# Patient Record
Sex: Female | Born: 1945 | Race: Black or African American | Hispanic: No | Marital: Married | Smoking: Never smoker
Health system: Southern US, Community
[De-identification: ages and names within clinical notes are randomized; demographics above are authoritative.]

## PROBLEM LIST (undated history)

## (undated) DIAGNOSIS — H409 Unspecified glaucoma: Secondary | ICD-10-CM

## (undated) DIAGNOSIS — K219 Gastro-esophageal reflux disease without esophagitis: Secondary | ICD-10-CM

## (undated) DIAGNOSIS — I10 Essential (primary) hypertension: Secondary | ICD-10-CM

## (undated) HISTORY — PX: ABDOMINAL HYSTERECTOMY: SHX81

## (undated) HISTORY — PX: NEPHRECTOMY: SHX65

## (undated) HISTORY — PX: APPENDECTOMY: SHX54

---

## 2015-11-11 ENCOUNTER — Encounter (HOSPITAL_BASED_OUTPATIENT_CLINIC_OR_DEPARTMENT_OTHER): Payer: Self-pay

## 2015-11-11 ENCOUNTER — Emergency Department (HOSPITAL_BASED_OUTPATIENT_CLINIC_OR_DEPARTMENT_OTHER)
Admission: EM | Admit: 2015-11-11 | Discharge: 2015-11-11 | Disposition: A | Discharge status: Home / Self Care | Payer: Medicare Other | Attending: Emergency Medicine | Admitting: Emergency Medicine

## 2015-11-11 ENCOUNTER — Emergency Department (HOSPITAL_BASED_OUTPATIENT_CLINIC_OR_DEPARTMENT_OTHER): Payer: Medicare Other

## 2015-11-11 DIAGNOSIS — Y999 Unspecified external cause status: Secondary | ICD-10-CM | POA: Insufficient documentation

## 2015-11-11 DIAGNOSIS — Y92811 Bus as the place of occurrence of the external cause: Secondary | ICD-10-CM | POA: Insufficient documentation

## 2015-11-11 DIAGNOSIS — Y939 Activity, unspecified: Secondary | ICD-10-CM | POA: Diagnosis not present

## 2015-11-11 DIAGNOSIS — I10 Essential (primary) hypertension: Secondary | ICD-10-CM | POA: Insufficient documentation

## 2015-11-11 DIAGNOSIS — S93402A Sprain of unspecified ligament of left ankle, initial encounter: Secondary | ICD-10-CM | POA: Diagnosis not present

## 2015-11-11 DIAGNOSIS — M25572 Pain in left ankle and joints of left foot: Secondary | ICD-10-CM | POA: Diagnosis present

## 2015-11-11 DIAGNOSIS — X501XXA Overexertion from prolonged static or awkward postures, initial encounter: Secondary | ICD-10-CM | POA: Insufficient documentation

## 2015-11-11 HISTORY — DX: Gastro-esophageal reflux disease without esophagitis: K21.9

## 2015-11-11 HISTORY — DX: Essential (primary) hypertension: I10

## 2015-11-11 MED ORDER — NAPROXEN 500 MG PO TABS: 500.0000 mg | ORAL_TABLET | Freq: Two times a day (BID) | ORAL | Status: DC

## 2015-11-11 NOTE — ED Notes (Signed)
Left ankle pain and swelling since this morning when she stepped off the bus and rolled her ankle.  Pt was able to ambulate with moderate difficulty throughout the day.

## 2015-11-11 NOTE — ED Notes (Signed)
Pt verbalizes understanding of d/c instructions and denies any further need at this time. 

## 2015-11-11 NOTE — ED Notes (Signed)
Twisted left ankle stepping off bus step this am-presents to triage in w/c

## 2015-11-11 NOTE — ED Provider Notes (Signed)
CSN: [id number]     Arrival date & time 11/11/15  1906 History   First MD Initiated Contact with Patient 11/11/15 1932     Chief Complaint  Patient presents with  . Ankle Pain     (Consider location/radiation/quality/duration/timing/severity/associated sxs/prior Treatment) HPI Comments: Patient presents today with left ankle pain.  She states that the pain has been present since rolling her ankle while stepping of the bus this morning.  This caused her to fall to the ground.  She states that she did fall back unto her buttocks when she fell.  She denies hitting her head or LOC.  She states that she has been limping on this leg since that time, but is able to ambulate.  Swelling of the ankle has worsened as the day has progressed.  She did not take anything for pain prior to arrival.  She denies any pain of the neck, back, knees, or hips.  No numbness or tingling.    The history is provided by the patient.    Past Medical History  Diagnosis Date  . Hypertension   . GERD (gastroesophageal reflux disease)    Past Surgical History  Procedure Laterality Date  . Abdominal hysterectomy    . Appendectomy     No family history on file. Social History  Substance Use Topics  . Smoking status: Never Smoker   . Smokeless tobacco: None  . Alcohol Use: No   OB History    No data available     Review of Systems  All other systems reviewed and are negative.     Allergies  Penicillins  Home Medications   Prior to Admission medications   Medication Sig Start Date End Date Taking? Authorizing Provider  UNKNOWN TO PATIENT BP med, GERD med, vitamins   Yes Historical Provider, MD   BP 151/93 mmHg  Pulse 84  Temp(Src) 98.9 F (37.2 C)  Resp 18  Ht  (1.549 m)  Wt 63.504 kg  BMI 26.47 kg/m2  SpO2 98% Physical Exam  Constitutional: She appears well-developed and well-nourished.  HENT:  Head: Normocephalic and atraumatic.  Neck: Normal range of motion. Neck supple.   Cardiovascular: Normal rate, regular rhythm and normal heart sounds.   Pulses:      Dorsalis pedis pulses are 2+ on the right side, and 2+ on the left side.  Pulmonary/Chest: Effort normal and breath sounds normal.  Musculoskeletal: Normal range of motion.       Left hip: She exhibits normal range of motion and no tenderness.       Left knee: She exhibits normal range of motion. No tenderness found.       Left ankle: She exhibits swelling. She exhibits normal range of motion, no ecchymosis and no deformity. Tenderness. Lateral malleolus and medial malleolus tenderness found. Achilles tendon normal. Achilles tendon exhibits no defect and normal Thompson's test results.       Cervical back: She exhibits normal range of motion, no tenderness, no bony tenderness and no swelling.       Thoracic back: She exhibits normal range of motion, no tenderness, no bony tenderness and no swelling.       Lumbar back: She exhibits normal range of motion, no tenderness, no bony tenderness and no swelling.  Diffuse swelling of the left ankle Left Achilles tendon is intact   Neurological: She is alert.  Distal sensation of left foot intact  Skin: Skin is warm and dry.  Psychiatric: She has a normal mood  and affect.  Nursing note and vitals reviewed.   ED Course  Procedures (including critical care time) Labs Review Labs Reviewed - No data to display  Imaging Review Dg Ankle Complete Left  11/11/2015  CLINICAL DATA:  Acute onset of left ankle pain and swelling, status post fall. Initial encounter. EXAM: LEFT ANKLE COMPLETE - 3+ VIEW COMPARISON:  None. FINDINGS: There is no evidence of fracture or dislocation. The ankle mortise is intact; the interosseous space is within normal limits. No talar tilt or subluxation is seen. Plantar and posterior calcaneal spurs are seen. An os peroneum is noted. The joint spaces are preserved. Diffuse soft tissue swelling is noted about the ankle. Edema is seen at Kager's fat  pad. IMPRESSION: 1. No evidence of fracture or dislocation. 2. Os peroneum noted. 3. Edema noted at Kager's fat pad. Electronically Signed   By: Roanna RaiderJeffery  Chang M.D.   On: 11/11/2015 19:52   I have personally reviewed and evaluated these images and lab results as part of my medical decision-making.   EKG Interpretation None      MDM   Final diagnoses:  None   Patient presents today with left ankle pain after rolling the ankle this morning while stepping off a bus.  Xray is negative for fracture or dislocation.  Xray does show edema at Kager's fat pad.  On exam, Achilles tendon is intact.  Negative Thompson's sign.  Neurovascularly intact.  Patient given ankle ASO and crutches.  Stable for discharge.  Return precautions given.      Santiago GladHeather Alenah Sarria, PA-C 11/12/15 2238  Cathren LaineKevin Steinl, MD 11/13/15 [phone]

## 2015-11-16 ENCOUNTER — Encounter: Payer: Self-pay | Admitting: Family Medicine

## 2015-11-16 ENCOUNTER — Ambulatory Visit (INDEPENDENT_AMBULATORY_CARE_PROVIDER_SITE_OTHER): Payer: Medicare Other | Admitting: Family Medicine

## 2015-11-16 VITALS — BP 153/96 | HR 69 | Ht 61.0 in | Wt 140.0 lb

## 2015-11-16 DIAGNOSIS — S99912A Unspecified injury of left ankle, initial encounter: Secondary | ICD-10-CM | POA: Diagnosis present

## 2015-11-16 NOTE — Patient Instructions (Signed)
You have a Grade 2 ankle sprain. Ice the area for 15 minutes at a time, 3-4 times a day Finish the naproxen from the ER then switch to taking tylenol 1-2 tabs three times a day as needed for pain Elevate above the level of your heart when possible if swollen. Use laceup ankle brace to help with stability while you recover from this injury. Come out of the boot/brace twice a day to do Up/down and alphabet exercises 2-3 sets of each. Start theraband strengthening exercises when directed - once a day 3 sets of 10 - I'd wait about a week to start these. Consider physical therapy for strengthening and balance exercises. If not improving as expected, we may repeat x-rays or consider further testing like an MRI. Follow up with me in 2 weeks.

## 2015-11-17 DIAGNOSIS — S99912A Unspecified injury of left ankle, initial encounter: Secondary | ICD-10-CM | POA: Insufficient documentation

## 2015-11-17 NOTE — Progress Notes (Signed)
PCP: No primary care provider on file.  Subjective:   HPI: Patient is a 70 y.o. female here for left ankle injury.  Patient reports on 5/31 [place] and accidentally inverted her left ankle. Could bear weight initially but was hobbling. By end of work day after sitting for a while couldn't get up due to pain and swelling lateral left ankle. No prior injuries. Has been taking naprosyn, icing, and using ASO. Pain is 5/10 level now, sharp and lateral. No skin changes, numbness.  Past Medical History  Diagnosis Date  . Hypertension   . GERD (gastroesophageal reflux disease)     Current Outpatient Prescriptions on File Prior to Visit  Medication Sig Dispense Refill  . naproxen (NAPROSYN) 500 MG tablet Take 1 tablet (500 mg total) by mouth 2 (two) times daily. 15 tablet 0  . UNKNOWN TO PATIENT BP med, GERD med, vitamins     No current facility-administered medications on file prior to visit.    Past Surgical History  Procedure Laterality Date  . Abdominal hysterectomy    . Appendectomy      Allergies  Allergen Reactions  . Penicillins Swelling    Social History   Social History  . Marital Status: Married    Spouse Name: N/A  . Number of Children: N/A  . Years of Education: N/A   Occupational History  . Not on file.   Social History Main Topics  . Smoking status: Never Smoker   . Smokeless tobacco: Not on file  . Alcohol Use: No  . Drug Use: No  . Sexual Activity: Not on file   Other Topics Concern  . Not on file   Social History Narrative    No family history on file.  BP 153/96 mmHg  Pulse 69  Ht 5\' 1"  (1.549 m)  Wt 140 lb (63.504 kg)  BMI 26.47 kg/m2  Review of Systems: See HPI above.    Objective:  Physical Exam:  Gen: NAD, comfortable in exam room  Left ankle: Mild anterolateral swelling.  No bruising, other deformity. Mild limitation ROM all directions. TTP over ATFL.  No malleolar, navicular, base 5th, other  tenderness.  Minimal tenderness lateral gastroc. 1+ ant drawer and talar tilt - talar tilt painful. Negative syndesmotic compression. Thompsons test negative. NV intact distally.  Right ankle: FROM without pain.    Assessment & Plan:  1. Left ankle injury - Independently reviewed radiographs and no evidence fracture.  Brief MSK u/s shows no cortical irregularities of fibular head, malleoli.  2/2 ankle sprain.  Continue naproxen, transition to tylenol.  Icing, ASO for stability.  Shown home exercises to do daily.  F/u in 2 weeks for reevaluation.

## 2015-11-17 NOTE — Assessment & Plan Note (Signed)
Independently reviewed radiographs and no evidence fracture.  Brief MSK u/s shows no cortical irregularities of fibular head, malleoli.  2/2 ankle sprain.  Continue naproxen, transition to tylenol.  Icing, ASO for stability.  Shown home exercises to do daily.  F/u in 2 weeks for reevaluation.

## 2015-12-04 ENCOUNTER — Ambulatory Visit: Payer: Medicare Other | Admitting: Family Medicine

## 2015-12-10 ENCOUNTER — Ambulatory Visit: Payer: Medicare Other | Admitting: Family Medicine

## 2021-05-03 ENCOUNTER — Other Ambulatory Visit: Payer: Self-pay

## 2021-05-03 ENCOUNTER — Emergency Department (HOSPITAL_BASED_OUTPATIENT_CLINIC_OR_DEPARTMENT_OTHER): Payer: Medicare Other

## 2021-05-03 ENCOUNTER — Emergency Department (HOSPITAL_BASED_OUTPATIENT_CLINIC_OR_DEPARTMENT_OTHER)
Admission: EM | Admit: 2021-05-03 | Discharge: 2021-05-04 | Disposition: A | Discharge status: Home / Self Care | Payer: Medicare Other | Source: Home / Self Care | Attending: Student | Admitting: Student

## 2021-05-03 ENCOUNTER — Encounter (HOSPITAL_BASED_OUTPATIENT_CLINIC_OR_DEPARTMENT_OTHER): Payer: Self-pay | Admitting: *Deleted

## 2021-05-03 DIAGNOSIS — Z20822 Contact with and (suspected) exposure to covid-19: Secondary | ICD-10-CM | POA: Insufficient documentation

## 2021-05-03 DIAGNOSIS — N3090 Cystitis, unspecified without hematuria: Secondary | ICD-10-CM

## 2021-05-03 DIAGNOSIS — Z79899 Other long term (current) drug therapy: Secondary | ICD-10-CM | POA: Insufficient documentation

## 2021-05-03 DIAGNOSIS — N3000 Acute cystitis without hematuria: Secondary | ICD-10-CM | POA: Diagnosis not present

## 2021-05-03 DIAGNOSIS — R300 Dysuria: Secondary | ICD-10-CM | POA: Insufficient documentation

## 2021-05-03 DIAGNOSIS — I10 Essential (primary) hypertension: Secondary | ICD-10-CM | POA: Insufficient documentation

## 2021-05-03 DIAGNOSIS — R7881 Bacteremia: Secondary | ICD-10-CM | POA: Diagnosis not present

## 2021-05-03 DIAGNOSIS — R509 Fever, unspecified: Secondary | ICD-10-CM | POA: Insufficient documentation

## 2021-05-03 DIAGNOSIS — R1030 Lower abdominal pain, unspecified: Secondary | ICD-10-CM | POA: Insufficient documentation

## 2021-05-03 LAB — CBC
HCT: 41.4 % (ref 36.0–46.0)
Hemoglobin: 13.8 g/dL (ref 12.0–15.0)
MCH: 30.5 pg (ref 26.0–34.0)
MCHC: 33.3 g/dL (ref 30.0–36.0)
MCV: 91.4 fL (ref 80.0–100.0)
Platelets: 302 10*3/uL (ref 150–400)
RBC: 4.53 MIL/uL (ref 3.87–5.11)
RDW: 11.5 % (ref 11.5–15.5)
WBC: 18.6 10*3/uL — ABNORMAL HIGH (ref 4.0–10.5)
nRBC: 0 % (ref 0.0–0.2)

## 2021-05-03 LAB — COMPREHENSIVE METABOLIC PANEL
ALT: 15 U/L (ref 0–44)
AST: 15 U/L (ref 15–41)
Albumin: 4.5 g/dL (ref 3.5–5.0)
Alkaline Phosphatase: 116 U/L (ref 38–126)
Anion gap: 11 (ref 5–15)
BUN: 17 mg/dL (ref 8–23)
CO2: 26 mmol/L (ref 22–32)
Calcium: 9.3 mg/dL (ref 8.9–10.3)
Chloride: 99 mmol/L (ref 98–111)
Creatinine, Ser: 1 mg/dL (ref 0.44–1.00)
GFR, Estimated: 59 mL/min — ABNORMAL LOW (ref >60)
Glucose, Bld: 100 mg/dL — ABNORMAL HIGH (ref 70–99)
Potassium: 3.1 mmol/L — ABNORMAL LOW (ref 3.5–5.1)
Sodium: 136 mmol/L (ref 135–145)
Total Bilirubin: 0.6 mg/dL (ref 0.3–1.2)
Total Protein: 8.3 g/dL — ABNORMAL HIGH (ref 6.5–8.1)

## 2021-05-03 LAB — URINALYSIS, ROUTINE W REFLEX MICROSCOPIC
Bilirubin Urine: NEGATIVE
Glucose, UA: NEGATIVE mg/dL
Ketones, ur: NEGATIVE mg/dL
Nitrite: NEGATIVE
Protein, ur: 30 mg/dL — AB
Specific Gravity, Urine: 1.02 (ref 1.005–1.030)
pH: 7 (ref 5.0–8.0)

## 2021-05-03 LAB — LACTIC ACID, PLASMA: Lactic Acid, Venous: 1.1 mmol/L (ref 0.5–1.9)

## 2021-05-03 LAB — RESP PANEL BY RT-PCR (FLU A&B, COVID) ARPGX2
Influenza A by PCR: NEGATIVE
Influenza B by PCR: NEGATIVE
SARS Coronavirus 2 by RT PCR: NEGATIVE

## 2021-05-03 LAB — URINALYSIS, MICROSCOPIC (REFLEX): WBC, UA: >50 WBC/hpf (ref 0–5)

## 2021-05-03 LAB — APTT: aPTT: 26 seconds (ref 24–36)

## 2021-05-03 LAB — PROTIME-INR
INR: 1 (ref 0.8–1.2)
Prothrombin Time: 12.8 seconds (ref 11.4–15.2)

## 2021-05-03 LAB — LIPASE, BLOOD: Lipase: 33 U/L (ref 11–51)

## 2021-05-03 MED ORDER — LACTATED RINGERS IV SOLN: INTRAVENOUS | Status: DC

## 2021-05-03 MED ORDER — LACTATED RINGERS IV BOLUS (SEPSIS): 250.0000 mL | Freq: Once | INTRAVENOUS | Status: DC

## 2021-05-03 MED ORDER — ONDANSETRON HCL 4 MG/2ML IJ SOLN: 4.0000 mg | Freq: Once | INTRAMUSCULAR | Status: DC

## 2021-05-03 MED ORDER — SODIUM CHLORIDE 0.9 % IV SOLN: 2.0000 g | Freq: Two times a day (BID) | INTRAVENOUS | Status: DC

## 2021-05-03 MED ORDER — SODIUM CHLORIDE 0.9 % IV SOLN
2.0000 g | Freq: Once | INTRAVENOUS | Status: AC
  Administered 2021-05-03: 2 g via INTRAVENOUS
  Filled 2021-05-03: qty 20

## 2021-05-03 MED ORDER — LACTATED RINGERS IV BOLUS (SEPSIS): 1000.0000 mL | Freq: Once | INTRAVENOUS | Status: DC

## 2021-05-03 MED ORDER — VANCOMYCIN HCL IN DEXTROSE 1-5 GM/200ML-% IV SOLN
1000.0000 mg | Freq: Once | INTRAVENOUS | Status: DC
  Filled 2021-05-03: qty 200

## 2021-05-03 MED ORDER — VANCOMYCIN HCL 750 MG/150ML IV SOLN: 750.0000 mg | INTRAVENOUS | Status: DC

## 2021-05-03 MED ORDER — SODIUM CHLORIDE 0.9 % IV SOLN
2.0000 g | Freq: Once | INTRAVENOUS | Status: DC
  Filled 2021-05-03: qty 2

## 2021-05-03 MED ORDER — METRONIDAZOLE 500 MG/100ML IV SOLN
500.0000 mg | Freq: Once | INTRAVENOUS | Status: AC
  Administered 2021-05-03: 500 mg via INTRAVENOUS
  Filled 2021-05-03: qty 100

## 2021-05-03 MED ORDER — ONDANSETRON 4 MG PO TBDP: 4.0000 mg | ORAL_TABLET | Freq: Three times a day (TID) | ORAL | 0 refills | Status: AC | PRN

## 2021-05-03 MED ORDER — LACTATED RINGERS IV BOLUS (SEPSIS)
1000.0000 mL | Freq: Once | INTRAVENOUS | Status: AC
  Administered 2021-05-03: 1000 mL via INTRAVENOUS

## 2021-05-03 MED ORDER — CEFADROXIL 500 MG PO CAPS: 500.0000 mg | ORAL_CAPSULE | Freq: Two times a day (BID) | ORAL | 0 refills | Status: DC

## 2021-05-03 MED ORDER — IOHEXOL 300 MG/ML  SOLN
100.0000 mL | Freq: Once | INTRAMUSCULAR | Status: AC | PRN
  Administered 2021-05-03: 100 mL via INTRAVENOUS

## 2021-05-03 NOTE — ED Triage Notes (Signed)
C/o right lower abd pain , chills , fever x  3 hrs ago

## 2021-05-03 NOTE — Sepsis Progress Note (Signed)
Elink following for Sepsis Protocol 

## 2021-05-03 NOTE — Progress Notes (Addendum)
Pharmacy Antibiotic Note  Tiffany Schultz is a 75 y.o. female admitted on 05/03/2021 with sepsis of unknown source.  WBC 18.6 and tmax 100 F. Pharmacy has been consulted for vancomycin and  cefepime dosing.  AUC goal: 400-550  Plan: Cefepime 2g q12h  Vancomycin 1000mg  x 1 load  Vancomycin 750mg  q24h (eAUC 413, Scr 1.0) F/u renal function and adjust regimen as needed Vancomycin as needed for monitoring   Height: 5\' 2"  (157.5 cm) Weight: 67.6 kg (149 lb) IBW/kg (Calculated) : 50.1  Temp (24hrs), Avg:100 F (37.8 C), Min:100 F (37.8 C), Max:100 F (37.8 C)  Recent Labs  Lab 05/03/21 1946  WBC 18.6*  CREATININE 1.00    Estimated Creatinine Clearance: 45.2 mL/min (by C-G formula based on SCr of 1 mg/dL).    Allergies  Allergen Reactions   Penicillins Swelling    Antimicrobials this admission: Flagyl 11/21 > Vancomycin 11/21> Cefepime 11/21 >  Dose adjustments this admission:  Microbiology results: 11/21 BCx: sent 11/21 UCx: sent   Thank you for allowing pharmacy to participate in this patient's care.  12/21, PharmD PGY1 Acute Care Resident  05/03/2021,  ___________  Addendum:  Discussed with Dr. 12/21 and likely urinary source. Cefepime and Vancomycin discontinued. Will continue flagyl and ceftriaxone for antimicrobial coverage.   Thank you for allowing pharmacy to participate in this patient's care.  Marja Kays, PharmD PGY1 Acute Care Resident  05/03/2021,9:28 PM

## 2021-05-03 NOTE — ED Provider Notes (Signed)
[facility] EMERGENCY DEPARTMENT Provider Note   CSN: [id number] Arrival date & time: 05/03/21  5027     History Chief Complaint  Patient presents with   Abdominal Pain    Tiffany Schultz is a 75 y.o. female with PMH GERD, hypertension who presents the emergency department for evaluation of abdominal pain, dysuria and fever.  Patient states that she has had symptoms over the course of this week but it acutely worsened over the last 3 hours.  She denies chest pain, shortness of breath, headache, diarrhea or other systemic symptoms.  Patient had single episode of emesis while here in the emergency department.      Abdominal Pain Associated symptoms: dysuria, fever and vomiting   Associated symptoms: no chest pain, no chills, no cough, no hematuria, no shortness of breath and no sore throat       Past Medical History:  Diagnosis Date   GERD (gastroesophageal reflux disease)    Hypertension     Patient Active Problem List   Diagnosis Date Noted   Left ankle injury 11/17/2015    Past Surgical History:  Procedure Laterality Date   ABDOMINAL HYSTERECTOMY     APPENDECTOMY     NEPHRECTOMY       OB History   No obstetric history on file.     No family history on file.  Social History   Tobacco Use   Smoking status: Never  Substance Use Topics   Alcohol use: No    Alcohol/week: 0.0 standard drinks   Drug use: No    Home Medications Prior to Admission medications   Medication Sig Start Date End Date Taking? Authorizing Provider  naproxen (NAPROSYN) 500 MG tablet Take 1 tablet (500 mg total) by mouth 2 (two) times daily. 11/11/15   Santiago Glad, PA-C  ranitidine (ZANTAC) 150 MG tablet  09/04/15   [provider]  triamterene-hydrochlorothiazide (DYAZIDE) 37.5-25 MG capsule Take by mouth.    [provider]  UNKNOWN TO PATIENT BP med, GERD med, vitamins    [provider]    Allergies    Penicillins  Review of Systems    Review of Systems  Constitutional:  Positive for fever. Negative for chills.  HENT:  Negative for ear pain and sore throat.   Eyes:  Negative for pain and visual disturbance.  Respiratory:  Negative for cough and shortness of breath.   Cardiovascular:  Negative for chest pain and palpitations.  Gastrointestinal:  Positive for abdominal pain and vomiting.  Genitourinary:  Positive for dysuria. Negative for hematuria.  Musculoskeletal:  Negative for arthralgias and back pain.  Skin:  Negative for color change and rash.  Neurological:  Negative for seizures and syncope.  All other systems reviewed and are negative.  Physical Exam Updated Vital Signs BP 129/85   Pulse (!) 102   Temp 100 F (37.8 C) (Oral)   Resp (!) 26   Ht 5\' 2"  (1.575 m)   Wt 67.6 kg   SpO2 98%   BMI 27.25 kg/m   Physical Exam Vitals and nursing note reviewed.  Constitutional:      General: She is not in acute distress.    Appearance: She is well-developed.  HENT:     Head: Normocephalic and atraumatic.  Eyes:     Conjunctiva/sclera: Conjunctivae normal.  Cardiovascular:     Rate and Rhythm: Regular rhythm. Tachycardia present.     Heart sounds: No murmur heard. Pulmonary:     Effort: Pulmonary effort is normal.  No respiratory distress.     Breath sounds: Normal breath sounds.  Abdominal:     Palpations: Abdomen is soft.     Tenderness: There is abdominal tenderness in the suprapubic area.  Musculoskeletal:        General: No swelling.     Cervical back: Neck supple.  Skin:    General: Skin is warm and dry.     Capillary Refill: Capillary refill takes less than 2 seconds.  Neurological:     Mental Status: She is alert.  Psychiatric:        Mood and Affect: Mood normal.    ED Results / Procedures / Treatments   Labs (all labs ordered are listed, but only abnormal results are displayed) Labs Reviewed  COMPREHENSIVE METABOLIC PANEL - Abnormal; Notable for the following components:      Result  Value   Potassium 3.1 (*)    Glucose, Bld 100 (*)    Total Protein 8.3 (*)    GFR, Estimated 59 (*)    All other components within normal limits  CBC - Abnormal; Notable for the following components:   WBC 18.6 (*)    All other components within normal limits  URINALYSIS, ROUTINE W REFLEX MICROSCOPIC - Abnormal; Notable for the following components:   APPearance CLOUDY (*)    Hgb urine dipstick MODERATE (*)    Protein, ur 30 (*)    Leukocytes,Ua LARGE (*)    All other components within normal limits  URINALYSIS, MICROSCOPIC (REFLEX) - Abnormal; Notable for the following components:   Bacteria, UA MANY (*)    All other components within normal limits  RESP PANEL BY RT-PCR (FLU A&B, COVID) ARPGX2  CULTURE, BLOOD (ROUTINE X 2)  CULTURE, BLOOD (ROUTINE X 2)  URINE CULTURE  LIPASE, BLOOD  LACTIC ACID, PLASMA  PROTIME-INR  APTT  LACTIC ACID, PLASMA    EKG None  Radiology CT ABDOMEN PELVIS W CONTRAST  Result Date: 05/03/2021 CLINICAL DATA:  Abdominal pain and fever. EXAM: CT ABDOMEN AND PELVIS WITH CONTRAST TECHNIQUE: Multidetector CT imaging of the abdomen and pelvis was performed using the standard protocol following bolus administration of intravenous contrast. CONTRAST:  OMNIPAQUE IOHEXOL 300 MG/ML  SOLN COMPARISON:  None. FINDINGS: Lower chest: The visualized lung bases are clear. No intra-abdominal free air or free fluid. Hepatobiliary: No focal liver abnormality is seen. No gallstones, gallbladder wall thickening, or biliary dilatation. Pancreas: Unremarkable. No pancreatic ductal dilatation or surrounding inflammatory changes. Spleen: Normal in size without focal abnormality. Adrenals/Urinary Tract: The adrenal glands are unremarkable. Status post prior left nephrectomy. Subcentimeter right renal hypodense lesion is not characterized. There is no hydronephrosis. The visualized ureter is unremarkable. Mild diffuse thickened appearance of the bladder wall with mucosal  enhancement. Correlation with urinalysis recommended to exclude cystitis. Stomach/Bowel: There is diffuse colonic diverticulosis without active inflammatory changes. There is no bowel obstruction or active inflammation. Appendectomy. Vascular/Lymphatic: The abdominal aorta and IVC are unremarkable. No portal venous gas. There is no adenopathy. Reproductive: Hysterectomy. Other: None Musculoskeletal: Degenerative changes of the spine and hips. No acute osseous pathology. IMPRESSION: 1. Mild diffuse thickened appearance of the bladder wall with mucosal enhancement. Correlation with urinalysis recommended to exclude cystitis. 2. Colonic diverticulosis. No bowel obstruction. 3. Status post prior left nephrectomy. Electronically Signed   By: Elgie Collard M.D.   On: 05/03/2021 22:04   DG Chest Port 1 View  Result Date: 05/03/2021 CLINICAL DATA:  Questionable sepsis - evaluate for abnormality Fever and chills. EXAM: PORTABLE  CHEST 1 VIEW COMPARISON:  Radiograph 08/23/2012.  CT 07/29/2020 FINDINGS: The cardiomediastinal contours are normal. Aortic atherosclerosis. Pulmonary vasculature is normal. No consolidation, pleural effusion, or pneumothorax. No acute osseous abnormalities are seen. IMPRESSION: No acute chest findings. Electronically Signed   By: Keith Rake M.D.   On: 05/03/2021 20:30    Procedures Procedures   Medications Ordered in ED Medications  lactated ringers infusion (has no administration in time range)  metroNIDAZOLE (FLAGYL) IVPB 500 mg (500 mg Intravenous New Bag/Given 05/03/21 2237)  ondansetron (ZOFRAN) injection 4 mg (has no administration in time range)  lactated ringers bolus 1,000 mL (1,000 mLs Intravenous New Bag/Given 05/03/21 2202)  cefTRIAXone (ROCEPHIN) 2 g in sodium chloride 0.9 % 100 mL IVPB (2 g Intravenous New Bag/Given 05/03/21 2204)  iohexol (OMNIPAQUE) 300 MG/ML solution 100 mL (100 mLs Intravenous Contrast Given 05/03/21 2137)    ED Course  I have reviewed  the triage vital signs and the nursing notes.  Pertinent labs & imaging results that were available during my care of the patient were reviewed by me and considered in my medical decision making (see chart for details).    MDM Rules/Calculators/A&P                           Patient seen the emergency department for evaluation of dysuria, abdominal pain, nausea, vomiting and fever.  Physical exam reveals a tachycardic patient with suprapubic tenderness but is otherwise unremarkable.  Patient initially tachycardic in the 120s white count of 18.6.  Sepsis alert called as patient is SIRS positive with the source likely urinary.  Broad-spectrum antibiotics initiated with ceftriaxone and Flagyl.  Lactate is normal and thus patient received only 1 L lactated Ringer's and did not need 30 cc/kg.  As the patient is not in septic shock with normal blood pressure also negates the need for over fluid resuscitation.  Laboratory evaluation with a mild hypokalemia to 3.1, urinalysis with large leuk esterase and greater than 50 white blood cells concerning for cystitis.  CT abdomen pelvis unremarkable outside of evidence of cystitis.  She was given Zofran and on reevaluation, her heart rate had normalized and the patient was able to ambulate without difficulty while here in the emergency department.  She also use the bathroom here with no persistent dysuria.  Using shared decision-making, we decided that the patient would be safe for discharge at this time with oral cefadroxil and Zofran for home use.  She was given very strict return precautions which she voiced understanding.  She was instructed to call her primary care physician and inform them of the plan and was discharged on antibiotics.  Urine culture is pending and we will call the patient if susceptibilities are abnormal. Final Clinical Impression(s) / ED Diagnoses Final diagnoses:  None    Rx / DC Orders ED Discharge Orders     None        Kalieb Freeland,  Burech Mcfarland, MD 05/03/21 2340

## 2021-05-04 ENCOUNTER — Telehealth (HOSPITAL_BASED_OUTPATIENT_CLINIC_OR_DEPARTMENT_OTHER): Payer: Self-pay

## 2021-05-04 ENCOUNTER — Encounter (HOSPITAL_COMMUNITY): Payer: Self-pay

## 2021-05-04 ENCOUNTER — Inpatient Hospital Stay (HOSPITAL_COMMUNITY)
Admission: EM | Admit: 2021-05-04 | Discharge: 2021-05-06 | DRG: 690 | Disposition: A | Discharge status: Home / Self Care | Payer: Medicare Other | Attending: Family Medicine | Admitting: Family Medicine

## 2021-05-04 DIAGNOSIS — I10 Essential (primary) hypertension: Secondary | ICD-10-CM | POA: Diagnosis present

## 2021-05-04 DIAGNOSIS — E785 Hyperlipidemia, unspecified: Secondary | ICD-10-CM | POA: Diagnosis present

## 2021-05-04 DIAGNOSIS — Z880 Allergy status to penicillin: Secondary | ICD-10-CM

## 2021-05-04 DIAGNOSIS — R7881 Bacteremia: Secondary | ICD-10-CM | POA: Diagnosis present

## 2021-05-04 DIAGNOSIS — B9620 Unspecified Escherichia coli [E. coli] as the cause of diseases classified elsewhere: Secondary | ICD-10-CM | POA: Diagnosis present

## 2021-05-04 DIAGNOSIS — Z905 Acquired absence of kidney: Secondary | ICD-10-CM

## 2021-05-04 DIAGNOSIS — K219 Gastro-esophageal reflux disease without esophagitis: Secondary | ICD-10-CM | POA: Diagnosis present

## 2021-05-04 DIAGNOSIS — N3000 Acute cystitis without hematuria: Principal | ICD-10-CM | POA: Diagnosis present

## 2021-05-04 DIAGNOSIS — Z888 Allergy status to other drugs, medicaments and biological substances status: Secondary | ICD-10-CM

## 2021-05-04 DIAGNOSIS — Z20822 Contact with and (suspected) exposure to covid-19: Secondary | ICD-10-CM | POA: Diagnosis present

## 2021-05-04 DIAGNOSIS — D72829 Elevated white blood cell count, unspecified: Secondary | ICD-10-CM

## 2021-05-04 DIAGNOSIS — Z885 Allergy status to narcotic agent status: Secondary | ICD-10-CM

## 2021-05-04 DIAGNOSIS — Z791 Long term (current) use of non-steroidal anti-inflammatories (NSAID): Secondary | ICD-10-CM

## 2021-05-04 DIAGNOSIS — Z79899 Other long term (current) drug therapy: Secondary | ICD-10-CM

## 2021-05-04 LAB — CBC WITH DIFFERENTIAL/PLATELET
Abs Immature Granulocytes: 0.04 10*3/uL (ref 0.00–0.07)
Basophils Absolute: 0.1 10*3/uL (ref 0.0–0.1)
Basophils Relative: 1 %
Eosinophils Absolute: 0.1 10*3/uL (ref 0.0–0.5)
Eosinophils Relative: 0 %
HCT: 36.6 % (ref 36.0–46.0)
Hemoglobin: 12.1 g/dL (ref 12.0–15.0)
Immature Granulocytes: 0 %
Lymphocytes Relative: 14 %
Lymphs Abs: 2.2 10*3/uL (ref 0.7–4.0)
MCH: 30.3 pg (ref 26.0–34.0)
MCHC: 33.1 g/dL (ref 30.0–36.0)
MCV: 91.5 fL (ref 80.0–100.0)
Monocytes Absolute: 2 10*3/uL — ABNORMAL HIGH (ref 0.1–1.0)
Monocytes Relative: 13 %
Neutro Abs: 11.1 10*3/uL — ABNORMAL HIGH (ref 1.7–7.7)
Neutrophils Relative %: 72 %
Platelets: 272 10*3/uL (ref 150–400)
RBC: 4 MIL/uL (ref 3.87–5.11)
RDW: 11.6 % (ref 11.5–15.5)
WBC: 15.4 10*3/uL — ABNORMAL HIGH (ref 4.0–10.5)
nRBC: 0 % (ref 0.0–0.2)

## 2021-05-04 LAB — BLOOD CULTURE ID PANEL (REFLEXED) - BCID2

## 2021-05-04 LAB — COMPREHENSIVE METABOLIC PANEL
ALT: 15 U/L (ref 0–44)
AST: 22 U/L (ref 15–41)
Albumin: 3.8 g/dL (ref 3.5–5.0)
Alkaline Phosphatase: 98 U/L (ref 38–126)
Anion gap: 10 (ref 5–15)
BUN: 11 mg/dL (ref 8–23)
CO2: 25 mmol/L (ref 22–32)
Calcium: 8.9 mg/dL (ref 8.9–10.3)
Chloride: 102 mmol/L (ref 98–111)
Creatinine, Ser: 0.88 mg/dL (ref 0.44–1.00)
GFR, Estimated: >60 mL/min (ref >60)
Glucose, Bld: 96 mg/dL (ref 70–99)
Potassium: 3.7 mmol/L (ref 3.5–5.1)
Sodium: 137 mmol/L (ref 135–145)
Total Bilirubin: 1.2 mg/dL (ref 0.3–1.2)
Total Protein: 7.7 g/dL (ref 6.5–8.1)

## 2021-05-04 MED ORDER — AMLODIPINE BESYLATE 10 MG PO TABS
10.0000 mg | ORAL_TABLET | Freq: Every day | ORAL | Status: DC
  Administered 2021-05-05 – 2021-05-06 (×2): 10 mg via ORAL
  Filled 2021-05-04 (×2): qty 1

## 2021-05-04 MED ORDER — SODIUM CHLORIDE 0.9 % IV SOLN
2.0000 g | INTRAVENOUS | Status: DC
  Administered 2021-05-05: 2 g via INTRAVENOUS
  Filled 2021-05-04: qty 20

## 2021-05-04 MED ORDER — HYDRALAZINE HCL 20 MG/ML IJ SOLN: 10.0000 mg | INTRAMUSCULAR | Status: DC | PRN

## 2021-05-04 MED ORDER — ROSUVASTATIN CALCIUM 5 MG PO TABS
5.0000 mg | ORAL_TABLET | Freq: Every day | ORAL | Status: DC
  Administered 2021-05-05 – 2021-05-06 (×2): 5 mg via ORAL
  Filled 2021-05-04 (×2): qty 1

## 2021-05-04 MED ORDER — ENOXAPARIN SODIUM 40 MG/0.4ML IJ SOSY
40.0000 mg | PREFILLED_SYRINGE | INTRAMUSCULAR | Status: DC
  Filled 2021-05-04 (×2): qty 0.4

## 2021-05-04 MED ORDER — PANTOPRAZOLE SODIUM 40 MG PO TBEC
40.0000 mg | DELAYED_RELEASE_TABLET | Freq: Every day | ORAL | Status: DC
  Administered 2021-05-05 – 2021-05-06 (×2): 40 mg via ORAL
  Filled 2021-05-04 (×2): qty 1

## 2021-05-04 MED ORDER — SODIUM CHLORIDE 0.9 % IV SOLN
2.0000 g | Freq: Once | INTRAVENOUS | Status: AC
  Administered 2021-05-04: 2 g via INTRAVENOUS
  Filled 2021-05-04: qty 20

## 2021-05-04 MED ORDER — LACTATED RINGERS IV BOLUS
1000.0000 mL | Freq: Once | INTRAVENOUS | Status: AC
  Administered 2021-05-04: 1000 mL via INTRAVENOUS

## 2021-05-04 MED ORDER — ACETAMINOPHEN 650 MG RE SUPP: 650.0000 mg | Freq: Four times a day (QID) | RECTAL | Status: DC | PRN

## 2021-05-04 MED ORDER — ACETAMINOPHEN 325 MG PO TABS
650.0000 mg | ORAL_TABLET | Freq: Four times a day (QID) | ORAL | Status: DC | PRN
  Administered 2021-05-05: 650 mg via ORAL
  Filled 2021-05-04: qty 2

## 2021-05-04 MED ORDER — LATANOPROST 0.005 % OP SOLN
1.0000 [drp] | Freq: Every day | OPHTHALMIC | Status: DC
  Administered 2021-05-04 – 2021-05-05 (×2): 1 [drp] via OPHTHALMIC
  Filled 2021-05-04: qty 2.5

## 2021-05-04 NOTE — H&P (Signed)
History and Physical    Tiffany Schultz [id number] DOB: 10-21-1945 DOA: 05/04/2021  PCP: Francesca Oman, DO  Patient coming from: Home.  Chief Complaint: Burning urination and fever.  HPI: Tiffany Schultz is a 75 y.o. female with history of hypertension and hyperlipidemia had come to the ER yesterday at med center with complaints of having dysuria subjective feeling of fever chills suprapubic pain and also right flank pain ongoing for a week.  In the ER patient had a CT abdomen pelvis shows features concerning for cystitis UA is consistent with UTI.  Patient also febrile with labs showing leukocytosis.  Had blood cultures drawn patient was sent home after IV dose of antibiotics.  Blood cultures grew E. coli and patient was advised to come to the ER.  ED Course: In the ER patient is hemodynamically stable temperature is 100 F labs show BC count of [id number] metabolic panel unremarkable mild suprapubic tenderness.  COVID test pending.  Patient started on ceftriaxone.  Review of Systems: As per HPI, rest all negative.   Past Medical History:  Diagnosis Date   GERD (gastroesophageal reflux disease)    Hypertension     Past Surgical History:  Procedure Laterality Date   ABDOMINAL HYSTERECTOMY     APPENDECTOMY     NEPHRECTOMY       reports that she has never smoked. She does not have any smokeless tobacco history on file. She reports that she does not drink alcohol and does not use drugs.  Allergies  Allergen Reactions   Penicillins Swelling    Family History  Family history unknown: Yes    Prior to Admission medications   Medication Sig Start Date End Date Taking? Authorizing Provider  cefadroxil (DURICEF) 500 MG capsule Take 1 capsule (500 mg total) by mouth 2 (two) times daily for 7 days. 05/03/21 05/10/21  Kommor, Madison, MD  naproxen (NAPROSYN) 500 MG tablet Take 1 tablet (500 mg total) by mouth 2 (two) times daily. 11/11/15   Hyman Bible, PA-C  ondansetron (ZOFRAN ODT) 4  MG disintegrating tablet Take 1 tablet (4 mg total) by mouth every 8 (eight) hours as needed for nausea or vomiting. 05/03/21   Kommor, Debe Coder, MD  ranitidine (ZANTAC) 150 MG tablet  09/04/15   [provider]  triamterene-hydrochlorothiazide (DYAZIDE) 37.5-25 MG capsule Take by mouth.    [provider]  UNKNOWN TO PATIENT BP med, GERD med, vitamins    [provider]    Physical Exam: Constitutional: Moderately built and nourished. Vitals:   05/04/21 1845 05/04/21 1900 05/04/21 1915 05/04/21 2107  BP: 125/80 128/78 128/79 136/80  Pulse: 94 88 88 99  Resp: 18  18   Temp:    99.3 F (37.4 C)  TempSrc:    Oral  SpO2: 99% 98% 98% 97%   Eyes: Anicteric no pallor. ENMT: No discharge from the ears eyes nose and mouth. Neck: No mass felt.  No neck rigidity. Respiratory: No rhonchi or crepitations. Cardiovascular: S1-S2 heard. Abdomen: Mild suprapubic tenderness no guarding or rigidity. Musculoskeletal: No edema. Skin: No rash. Neurologic: Alert awake oriented to time place and person.  Moves all extremities. Psychiatric: Appears normal.  Normal affect.   Labs on Admission: I have personally reviewed following labs and imaging studies  CBC: Recent Labs  Lab 05/03/21 1946 05/04/21 1827  WBC 18.6* 15.4*  NEUTROABS  --  11.1*  HGB 13.8 12.1  HCT 41.4 36.6  MCV 91.4 91.5  PLT 302 [id number]   Basic Metabolic  Panel: Recent Labs  Lab 05/03/21 1946 05/04/21 1827  NA 136 137  K 3.1* 3.7  CL 99 102  CO2 26 25  GLUCOSE 100* 96  BUN 17 11  CREATININE 1.00 0.88  CALCIUM 9.3 8.9   GFR: Estimated Creatinine Clearance: 51.3 mL/min (by C-G formula based on SCr of 0.88 mg/dL). Liver Function Tests: Recent Labs  Lab 05/03/21 1946 05/04/21 1827  AST 15 22  ALT 15 15  ALKPHOS 116 98  BILITOT 0.6 1.2  PROT 8.3* 7.7  ALBUMIN 4.5 3.8   Recent Labs  Lab 05/03/21 1946  LIPASE 33   No results for input(s): AMMONIA in the last 168 hours. Coagulation  Profile: Recent Labs  Lab 05/03/21 2058  INR 1.0   Cardiac Enzymes: No results for input(s): CKTOTAL, CKMB, CKMBINDEX, TROPONINI in the last 168 hours. BNP (last 3 results) No results for input(s): PROBNP in the last 8760 hours. HbA1C: No results for input(s): HGBA1C in the last 72 hours. CBG: No results for input(s): GLUCAP in the last 168 hours. Lipid Profile: No results for input(s): CHOL, HDL, LDLCALC, TRIG, CHOLHDL, LDLDIRECT in the last 72 hours. Thyroid Function Tests: No results for input(s): TSH, T4TOTAL, FREET4, T3FREE, THYROIDAB in the last 72 hours. Anemia Panel: No results for input(s): VITAMINB12, FOLATE, FERRITIN, TIBC, IRON, RETICCTPCT in the last 72 hours. Urine analysis:    Component Value Date/Time   COLORURINE YELLOW 05/03/2021 2016   APPEARANCEUR CLOUDY (A) 05/03/2021 2016   LABSPEC 1.020 05/03/2021 2016   PHURINE 7.0 05/03/2021 2016   GLUCOSEU NEGATIVE 05/03/2021 2016   HGBUR MODERATE (A) 05/03/2021 2016   BILIRUBINUR NEGATIVE 05/03/2021 2016   KETONESUR NEGATIVE 05/03/2021 2016   PROTEINUR 30 (A) 05/03/2021 2016   NITRITE NEGATIVE 05/03/2021 2016   LEUKOCYTESUR LARGE (A) 05/03/2021 2016   Sepsis Labs: @LABRCNTIP (procalcitonin:4,lacticidven:4) ) Recent Results (from the past 240 hour(s))  Urine Culture     Status: None (Preliminary result)   Collection Time: 05/03/21  8:18 PM   Specimen: In/Out Cath Urine  Result Value Ref Range Status   Specimen Description   Final    IN/OUT CATH URINE Performed at [facility], [address] [place], [place] [address]    Special Requests   Final    NONE Performed at [facility], [address] [place], [place] [address]    Culture   Final    CULTURE REINCUBATED FOR BETTER GROWTH Performed at [facility], [place] [address], [place], [place] [address]    Report Status PENDING  Incomplete  Blood Culture (routine x 2)     Status: None (Preliminary result)   Collection Time:  05/03/21  8:40 PM   Specimen: BLOOD LEFT FOREARM  Result Value Ref Range Status   Specimen Description   Final    BLOOD LEFT FOREARM Performed at [facility], [address] [place], [place] [address]    Special Requests   Final    BOTTLES DRAWN AEROBIC AND ANAEROBIC Blood Culture adequate volume Performed at [facility], [address] [place], [place] [address]    Culture  Setup Time   Final    GRAM NEGATIVE RODS IN BOTH AEROBIC AND ANAEROBIC BOTTLES Organism ID to follow CRITICAL RESULT CALLED TO, READ BACK BY AND VERIFIED WITH: Bernita Raisin RN [id number] 05/04/21 A BROWNING Performed at [facility], [place] [address], [place], [place] [address]    Culture PENDING  Incomplete   Report Status PENDING  Incomplete  Blood Culture ID Panel (Reflexed)     Status: Abnormal   Collection Time: 05/03/21  8:40 PM  Result Value Ref Range Status   Enterococcus faecalis NOT DETECTED NOT DETECTED Final   Enterococcus Faecium NOT DETECTED NOT DETECTED Final   Listeria monocytogenes NOT DETECTED NOT DETECTED Final   Staphylococcus species NOT DETECTED NOT DETECTED Final   Staphylococcus aureus (BCID) NOT DETECTED NOT DETECTED Final   Staphylococcus epidermidis NOT DETECTED NOT DETECTED Final   Staphylococcus lugdunensis NOT DETECTED NOT DETECTED Final   Streptococcus species NOT DETECTED NOT DETECTED Final   Streptococcus agalactiae NOT DETECTED NOT DETECTED Final   Streptococcus pneumoniae NOT DETECTED NOT DETECTED Final   Streptococcus pyogenes NOT DETECTED NOT DETECTED Final   A.calcoaceticus-baumannii NOT DETECTED NOT DETECTED Final   Bacteroides fragilis NOT DETECTED NOT DETECTED Final   Enterobacterales DETECTED (A) NOT DETECTED Final    Comment: Enterobacterales represent a large order of gram negative bacteria, not a single organism. CRITICAL RESULT CALLED TO, READ BACK BY AND VERIFIED WITH: Bernita Raisin RN [id number] 05/04/21 A BROWNING    Enterobacter cloacae complex  NOT DETECTED NOT DETECTED Final   Escherichia coli DETECTED (A) NOT DETECTED Final    Comment: CRITICAL RESULT CALLED TO, READ BACK BY AND VERIFIED WITH: Bernita Raisin RN [phone] 05/04/21 A BROWNING    Klebsiella aerogenes NOT DETECTED NOT DETECTED Final   Klebsiella oxytoca NOT DETECTED NOT DETECTED Final   Klebsiella pneumoniae NOT DETECTED NOT DETECTED Final   Proteus species NOT DETECTED NOT DETECTED Final   Salmonella species NOT DETECTED NOT DETECTED Final   Serratia marcescens NOT DETECTED NOT DETECTED Final   Haemophilus influenzae NOT DETECTED NOT DETECTED Final   Neisseria meningitidis NOT DETECTED NOT DETECTED Final   Pseudomonas aeruginosa NOT DETECTED NOT DETECTED Final   Stenotrophomonas maltophilia NOT DETECTED NOT DETECTED Final   Candida albicans NOT DETECTED NOT DETECTED Final   Candida auris NOT DETECTED NOT DETECTED Final   Candida glabrata NOT DETECTED NOT DETECTED Final   Candida krusei NOT DETECTED NOT DETECTED Final   Candida parapsilosis NOT DETECTED NOT DETECTED Final   Candida tropicalis NOT DETECTED NOT DETECTED Final   Cryptococcus neoformans/gattii NOT DETECTED NOT DETECTED Final   CTX-M ESBL NOT DETECTED NOT DETECTED Final   Carbapenem resistance IMP NOT DETECTED NOT DETECTED Final   Carbapenem resistance KPC NOT DETECTED NOT DETECTED Final   Carbapenem resistance NDM NOT DETECTED NOT DETECTED Final   Carbapenem resist OXA 48 LIKE NOT DETECTED NOT DETECTED Final   Carbapenem resistance VIM NOT DETECTED NOT DETECTED Final    Comment: Performed at [facility], [address], [place], [place] [address]  Blood Culture (routine x 2)     Status: None (Preliminary result)   Collection Time: 05/03/21  8:50 PM   Specimen: Left Antecubital; Blood  Result Value Ref Range Status   Specimen Description   Final    LEFT ANTECUBITAL Performed at [facility], [address] [place], [place] [address]    Special Requests   Final    BOTTLES DRAWN AEROBIC  AND ANAEROBIC Blood Culture adequate volume Performed at [facility], [address] [place], [place] [address]    Culture   Final    NO GROWTH < 12 HOURS Performed at [facility], [place] [address] [place], [place] [address]    Report Status PENDING  Incomplete  Resp  Panel by RT-PCR (Flu A&B, Covid) Nasopharyngeal Swab     Status: None   Collection Time: 05/03/21 10:05 PM   Specimen: Nasopharyngeal Swab; Nasopharyngeal(NP) swabs in vial transport medium  Result Value Ref Range Status   SARS Coronavirus 2 by RT PCR NEGATIVE NEGATIVE Final    Comment: (NOTE) SARS-CoV-2 target nucleic acids are NOT DETECTED.  The SARS-CoV-2 RNA is generally detectable in upper respiratory specimens during the acute phase of infection. The lowest concentration of SARS-CoV-2 viral copies this assay can detect is 138 copies/mL. A negative result does not preclude SARS-Cov-2 infection and should not be used as the sole basis for treatment or other patient management decisions. A negative result may occur with  improper specimen collection/handling, submission of specimen other than nasopharyngeal swab, presence of viral mutation(s) within the areas targeted by this assay, and inadequate number of viral copies(<138 copies/mL). A negative result must be combined with clinical observations, patient history, and epidemiological information. The expected result is Negative.  Fact Sheet for Patients:  EntrepreneurPulse.com.au  Fact Sheet for Healthcare Providers:  IncredibleEmployment.be  This test is no t yet approved or cleared by the [place] FDA and  has been authorized for detection and/or diagnosis of SARS-CoV-2 by FDA under an Emergency Use Authorization (EUA). This EUA will remain  in effect (meaning this test can be used) for the duration of the COVID-19 declaration under Section 564(b)(1) of the Act, 21 U.S.C.section 360bbb-3(b)(1),  unless the authorization is terminated  or revoked sooner.       Influenza A by PCR NEGATIVE NEGATIVE Final   Influenza B by PCR NEGATIVE NEGATIVE Final    Comment: (NOTE) The Xpert Xpress SARS-CoV-2/FLU/RSV plus assay is intended as an aid in the diagnosis of influenza from Nasopharyngeal swab specimens and should not be used as a sole basis for treatment. Nasal washings and aspirates are unacceptable for Xpert Xpress SARS-CoV-2/FLU/RSV testing.  Fact Sheet for Patients: EntrepreneurPulse.com.au  Fact Sheet for Healthcare Providers: IncredibleEmployment.be  This test is not yet approved or cleared by the [place] FDA and has been authorized for detection and/or diagnosis of SARS-CoV-2 by FDA under an Emergency Use Authorization (EUA). This EUA will remain in effect (meaning this test can be used) for the duration of the COVID-19 declaration under Section 564(b)(1) of the Act, 21 U.S.C. section 360bbb-3(b)(1), unless the authorization is terminated or revoked.  Performed at [facility], [address] [place], [place] [address]      Radiological Exams on Admission: CT ABDOMEN PELVIS W CONTRAST  Result Date: 05/03/2021 CLINICAL DATA:  Abdominal pain and fever. EXAM: CT ABDOMEN AND PELVIS WITH CONTRAST TECHNIQUE: Multidetector CT imaging of the abdomen and pelvis was performed using the standard protocol following bolus administration of intravenous contrast. CONTRAST:  135mL OMNIPAQUE IOHEXOL 300 MG/ML  SOLN COMPARISON:  None. FINDINGS: Lower chest: The visualized lung bases are clear. No intra-abdominal free air or free fluid. Hepatobiliary: No focal liver abnormality is seen. No gallstones, gallbladder wall thickening, or biliary dilatation. Pancreas: Unremarkable. No pancreatic ductal dilatation or surrounding inflammatory changes. Spleen: Normal in size without focal abnormality. Adrenals/Urinary Tract: The adrenal glands  are unremarkable. Status post prior left nephrectomy. Subcentimeter right renal hypodense lesion is not characterized. There is no hydronephrosis. The visualized ureter is unremarkable. Mild diffuse thickened appearance of the bladder wall with mucosal enhancement. Correlation with urinalysis recommended to exclude cystitis. Stomach/Bowel: There is diffuse colonic diverticulosis without active inflammatory changes. There is no bowel obstruction or  active inflammation. Appendectomy. Vascular/Lymphatic: The abdominal aorta and IVC are unremarkable. No portal venous gas. There is no adenopathy. Reproductive: Hysterectomy. Other: None Musculoskeletal: Degenerative changes of the spine and hips. No acute osseous pathology. IMPRESSION: 1. Mild diffuse thickened appearance of the bladder wall with mucosal enhancement. Correlation with urinalysis recommended to exclude cystitis. 2. Colonic diverticulosis. No bowel obstruction. 3. Status post prior left nephrectomy. Electronically Signed   By: Elgie Collard M.D.   On: 05/03/2021 22:04   DG Chest Port 1 View  Result Date: 05/03/2021 CLINICAL DATA:  Questionable sepsis - evaluate for abnormality Fever and chills. EXAM: PORTABLE CHEST 1 VIEW COMPARISON:  Radiograph 08/23/2012.  CT 07/29/2020 FINDINGS: The cardiomediastinal contours are normal. Aortic atherosclerosis. Pulmonary vasculature is normal. No consolidation, pleural effusion, or pneumothorax. No acute osseous abnormalities are seen. IMPRESSION: No acute chest findings. Electronically Signed   By: Narda Rutherford M.D.   On: 05/03/2021 20:30     Assessment/Plan Principal Problem:   Bacteremia Active Problems:   Acute cystitis without hematuria   Essential hypertension    E. coli bacteremia likely source urinary tract infection presently on ceftriaxone follow culture and sensitivity.  Patient denies any chest pain shortness of breath or any joint pain or any skin rash. Hypertension takes amlodipine  and Lasix home dose needs to be verified.  We will keep patient on p.o. and IV hydralazine for now. History of hyperlipidemia on statins Home dose needs to be verified.  COVID test pending.   DVT prophylaxis: Lovenox. Code Status: Full code. Family Communication: Discussed with patient. Disposition Plan: Home. Consults called: None. Admission status: Observation.   Eduard Clos MD [facility] Pager [phone].  If 7PM-7AM, please contact night-coverage [email or web address] Password [facility]  05/04/2021, 9:55 PM

## 2021-05-04 NOTE — Telephone Encounter (Signed)
This RN was made aware of patient positive blood cultures from visit yesterday, shared this information with Rhunette Croft ED MD currently working who advises to call patient and have her return to ED for admission. Called and explain to patient who agrees to return to hospital.

## 2021-05-04 NOTE — ED Provider Notes (Signed)
[facility]-EMER[facility]PT Provider Note   CSN: [id number] Arrival date & time: 05/04/21  1720     History Chief Complaint  Patient presents with   Cystitis   Abnormal Lab    Tiffany Schultz is a 75 y.o. female with PMH GERD, HTN who presents the emergency department for positive blood cultures.  I personally saw the patient yesterday at [facility] where she arrived with suprapubic abdominal pain and dysuria as well as tachycardia with an elevated white count.  Patient initially was a sepsis alert but had a normal lactate and after fluid resuscitation and first round of ceftriaxone, we used shared decision-making and the patient was ultimately discharged home on Duricef.  She received a call this morning stating that one of her blood cultures tested positive for E. coli and she was informed to return to the emergency department for persistent IV antibiotics.  She currently states that her symptoms have overall improved but are still present with very mild suprapubic abdominal pain and intermittent dysuria.  Denies chest pain, shortness breath, nausea, vomiting, fever or other systemic symptoms.  Abnormal Lab     Past Medical History:  Diagnosis Date   GERD (gastroesophageal reflux disease)    Hypertension     Patient Active Problem List   Diagnosis Date Noted   Left ankle injury 11/17/2015    Past Surgical History:  Procedure Laterality Date   ABDOMINAL HYSTERECTOMY     APPENDECTOMY     NEPHRECTOMY       OB History   No obstetric history on file.     No family history on file.  Social History   Tobacco Use   Smoking status: Never  Substance Use Topics   Alcohol use: No    Alcohol/week: 0.0 standard drinks   Drug use: No    Home Medications Prior to Admission medications   Medication Sig Start Date End Date Taking? Authorizing Provider  cefadroxil (DURICEF) 500 MG capsule Take 1 capsule (500 mg total) by mouth 2 (two) times daily  for 7 days. 05/03/21 05/10/21  Chastelyn Athens, MD  naproxen (NAPROSYN) 500 MG tablet Take 1 tablet (500 mg total) by mouth 2 (two) times daily. 11/11/15   Santiago Glad, PA-C  ondansetron (ZOFRAN ODT) 4 MG disintegrating tablet Take 1 tablet (4 mg total) by mouth every 8 (eight) hours as needed for nausea or vomiting. 05/03/21   Duaa Stelzner, Wyn Forster, MD  ranitidine (ZANTAC) 150 MG tablet  09/04/15   [provider]  triamterene-hydrochlorothiazide (DYAZIDE) 37.5-25 MG capsule Take by mouth.    [provider]  UNKNOWN TO PATIENT BP med, GERD med, vitamins    [provider]    Allergies    Penicillins  Review of Systems   Review of Systems  Constitutional:  Negative for chills and fever.  HENT:  Negative for ear pain and sore throat.   Eyes:  Negative for pain and visual disturbance.  Respiratory:  Negative for cough and shortness of breath.   Cardiovascular:  Negative for chest pain and palpitations.  Gastrointestinal:  Negative for abdominal pain and vomiting.  Genitourinary:  Positive for dysuria. Negative for hematuria.  Musculoskeletal:  Negative for arthralgias and back pain.  Skin:  Negative for color change and rash.  Neurological:  Negative for seizures and syncope.  All other systems reviewed and are negative.  Physical Exam Updated Vital Signs BP 128/81 (BP Location: Left Arm)   Pulse (!) 110   Temp 99.7  F (37.6 C) (Oral)   Resp 18   SpO2 97%   Physical Exam Vitals and nursing note reviewed.  Constitutional:      General: She is not in acute distress.    Appearance: She is well-developed.  HENT:     Head: Normocephalic and atraumatic.  Eyes:     Conjunctiva/sclera: Conjunctivae normal.  Cardiovascular:     Rate and Rhythm: Normal rate and regular rhythm.     Heart sounds: No murmur heard. Pulmonary:     Effort: Pulmonary effort is normal. No respiratory distress.     Breath sounds: Normal breath sounds.  Abdominal:     Palpations:  Abdomen is soft.     Tenderness: There is abdominal tenderness (suprapubic).  Musculoskeletal:        General: No swelling.     Cervical back: Neck supple.  Skin:    General: Skin is warm and dry.     Capillary Refill: Capillary refill takes less than 2 seconds.  Neurological:     Mental Status: She is alert.  Psychiatric:        Mood and Affect: Mood normal.    ED Results / Procedures / Treatments   Labs (all labs ordered are listed, but only abnormal results are displayed) Labs Reviewed - No data to display  EKG None  Radiology CT ABDOMEN PELVIS W CONTRAST  Result Date: 05/03/2021 CLINICAL DATA:  Abdominal pain and fever. EXAM: CT ABDOMEN AND PELVIS WITH CONTRAST TECHNIQUE: Multidetector CT imaging of the abdomen and pelvis was performed using the standard protocol following bolus administration of intravenous contrast. CONTRAST:  126mL OMNIPAQUE IOHEXOL 300 MG/ML  SOLN COMPARISON:  None. FINDINGS: Lower chest: The visualized lung bases are clear. No intra-abdominal free air or free fluid. Hepatobiliary: No focal liver abnormality is seen. No gallstones, gallbladder wall thickening, or biliary dilatation. Pancreas: Unremarkable. No pancreatic ductal dilatation or surrounding inflammatory changes. Spleen: Normal in size without focal abnormality. Adrenals/Urinary Tract: The adrenal glands are unremarkable. Status post prior left nephrectomy. Subcentimeter right renal hypodense lesion is not characterized. There is no hydronephrosis. The visualized ureter is unremarkable. Mild diffuse thickened appearance of the bladder wall with mucosal enhancement. Correlation with urinalysis recommended to exclude cystitis. Stomach/Bowel: There is diffuse colonic diverticulosis without active inflammatory changes. There is no bowel obstruction or active inflammation. Appendectomy. Vascular/Lymphatic: The abdominal aorta and IVC are unremarkable. No portal venous gas. There is no adenopathy. Reproductive:  Hysterectomy. Other: None Musculoskeletal: Degenerative changes of the spine and hips. No acute osseous pathology. IMPRESSION: 1. Mild diffuse thickened appearance of the bladder wall with mucosal enhancement. Correlation with urinalysis recommended to exclude cystitis. 2. Colonic diverticulosis. No bowel obstruction. 3. Status post prior left nephrectomy. Electronically Signed   By: Anner Crete M.D.   On: 05/03/2021 22:04   DG Chest Port 1 View  Result Date: 05/03/2021 CLINICAL DATA:  Questionable sepsis - evaluate for abnormality Fever and chills. EXAM: PORTABLE CHEST 1 VIEW COMPARISON:  Radiograph 08/23/2012.  CT 07/29/2020 FINDINGS: The cardiomediastinal contours are normal. Aortic atherosclerosis. Pulmonary vasculature is normal. No consolidation, pleural effusion, or pneumothorax. No acute osseous abnormalities are seen. IMPRESSION: No acute chest findings. Electronically Signed   By: Keith Rake M.D.   On: 05/03/2021 20:30    Procedures Procedures   Medications Ordered in ED Medications - No data to display  ED Course  I have reviewed the triage vital signs and the nursing notes.  Pertinent labs & imaging results that were available during  my care of the patient were reviewed by me and considered in my medical decision making (see chart for details).    MDM Rules/Calculators/A&P                           Patient seen emergency department for evaluation of positive blood cultures.  Physical exam reveals mild suprapubic abdominal tenderness but is otherwise unremarkable.  Laboratory evaluation reveals a persistent leukocytosis but is otherwise unremarkable.  Blood cultures repeated.  Her next dose of ceftriaxone is scheduled for 10 PM tonight and she will require admission for clearance of blood cultures.  Patient then admitted. Final Clinical Impression(s) / ED Diagnoses Final diagnoses:  None    Rx / DC Orders ED Discharge Orders     None        Coreen Shippee, Debe Coder,  MD 05/04/21 2014

## 2021-05-04 NOTE — ED Triage Notes (Signed)
Pt states she was seen yesterday at [facility] and was diagnosed with cystitis. Pt was called and informed today to come back to the ed due to her blood cultures test positive for E.Choli and Enterobaterales

## 2021-05-05 DIAGNOSIS — Z79899 Other long term (current) drug therapy: Secondary | ICD-10-CM | POA: Diagnosis not present

## 2021-05-05 DIAGNOSIS — K219 Gastro-esophageal reflux disease without esophagitis: Secondary | ICD-10-CM | POA: Diagnosis present

## 2021-05-05 DIAGNOSIS — Z791 Long term (current) use of non-steroidal anti-inflammatories (NSAID): Secondary | ICD-10-CM | POA: Diagnosis not present

## 2021-05-05 DIAGNOSIS — E785 Hyperlipidemia, unspecified: Secondary | ICD-10-CM

## 2021-05-05 DIAGNOSIS — Z885 Allergy status to narcotic agent status: Secondary | ICD-10-CM | POA: Diagnosis not present

## 2021-05-05 DIAGNOSIS — I10 Essential (primary) hypertension: Secondary | ICD-10-CM

## 2021-05-05 DIAGNOSIS — N3000 Acute cystitis without hematuria: Secondary | ICD-10-CM | POA: Diagnosis present

## 2021-05-05 DIAGNOSIS — R7881 Bacteremia: Secondary | ICD-10-CM | POA: Diagnosis present

## 2021-05-05 DIAGNOSIS — B9620 Unspecified Escherichia coli [E. coli] as the cause of diseases classified elsewhere: Secondary | ICD-10-CM | POA: Diagnosis present

## 2021-05-05 DIAGNOSIS — D72829 Elevated white blood cell count, unspecified: Secondary | ICD-10-CM

## 2021-05-05 DIAGNOSIS — Z888 Allergy status to other drugs, medicaments and biological substances status: Secondary | ICD-10-CM | POA: Diagnosis not present

## 2021-05-05 DIAGNOSIS — Z880 Allergy status to penicillin: Secondary | ICD-10-CM | POA: Diagnosis not present

## 2021-05-05 DIAGNOSIS — Z20822 Contact with and (suspected) exposure to covid-19: Secondary | ICD-10-CM | POA: Diagnosis present

## 2021-05-05 DIAGNOSIS — Z905 Acquired absence of kidney: Secondary | ICD-10-CM | POA: Diagnosis not present

## 2021-05-05 LAB — BASIC METABOLIC PANEL
Anion gap: 8 (ref 5–15)
BUN: 9 mg/dL (ref 8–23)
CO2: 25 mmol/L (ref 22–32)
Calcium: 9 mg/dL (ref 8.9–10.3)
Chloride: 105 mmol/L (ref 98–111)
Creatinine, Ser: 0.79 mg/dL (ref 0.44–1.00)
GFR, Estimated: >60 mL/min (ref >60)
Glucose, Bld: 103 mg/dL — ABNORMAL HIGH (ref 70–99)
Potassium: 3.4 mmol/L — ABNORMAL LOW (ref 3.5–5.1)
Sodium: 138 mmol/L (ref 135–145)

## 2021-05-05 LAB — CBC
HCT: 35.2 % — ABNORMAL LOW (ref 36.0–46.0)
Hemoglobin: 11.7 g/dL — ABNORMAL LOW (ref 12.0–15.0)
MCH: 30.5 pg (ref 26.0–34.0)
MCHC: 33.2 g/dL (ref 30.0–36.0)
MCV: 91.7 fL (ref 80.0–100.0)
Platelets: 255 10*3/uL (ref 150–400)
RBC: 3.84 MIL/uL — ABNORMAL LOW (ref 3.87–5.11)
RDW: 11.4 % — ABNORMAL LOW (ref 11.5–15.5)
WBC: 11.7 10*3/uL — ABNORMAL HIGH (ref 4.0–10.5)
nRBC: 0 % (ref 0.0–0.2)

## 2021-05-05 LAB — RESP PANEL BY RT-PCR (FLU A&B, COVID) ARPGX2
Influenza A by PCR: NEGATIVE
Influenza B by PCR: NEGATIVE
SARS Coronavirus 2 by RT PCR: NEGATIVE

## 2021-05-05 NOTE — Progress Notes (Signed)
PROGRESS NOTE    Tiffany Schultz  WUJ:[id number] DOB: 05/29/1946 DOA: 05/04/2021 PCP: Yolanda Manges, DO   Brief Narrative: Tiffany Schultz is a 75 y.o. female with a history of hyperlipidemia, hypertension, GERD. Patient presented secondary to abnormal blood culture significant for GNR. Empiric Ceftriaxone initiated on admission.   Assessment & Plan:   * E coli bacteremia Secondary to UTI. Sensitivities pending. Started empirically on Ceftriaxone IV -Continue Ceftriaxone IV -Follow up blood cultures  Leukocytosis Secondary to bacteremia/UTI. Improving with antibiotics.  Hyperlipidemia -Continue Crestor  GERD (gastroesophageal reflux disease) Patient is on Prilosec as an outpatient -Continue Protonix (substituted for Prilosec)  Essential hypertension -Continue amlodipine  Acute cystitis without hematuria Recently diagnosed. Previously started empirically on cefadroxil. Urine culture from 11/21 with pending sensitivities. -Antibiotics as mentioned above.      DVT prophylaxis: Lovenox/SCDs/ambulation Code Status:   Code Status: Full Code Family Communication: Husband at bedside Disposition Plan: Discharge home likely in 24 hours pending blood culture sensitivities and transition to oral antibiotics   Consultants:  None  Procedures:  None  Antimicrobials: Ceftriaxone IV    Subjective: No concerns overnight. Tmax of 100.6 F.  Objective: Vitals:   05/05/21 0110 05/05/21 0509 05/05/21 0907 05/05/21 1253  BP: 135/82 115/70 126/72 129/83  Pulse: 100 87 89 93  Resp: 18 18 18 18   Temp: (!) 100.6 F (38.1 C) 98.5 F (36.9 C) 97.8 F (36.6 C) 98.6 F (37 C)  TempSrc: Oral Oral Oral Oral  SpO2: 96% 96% 95% 97%  Weight:      Height:        Intake/Output Summary (Last 24 hours) at 05/05/2021 1425 Last data filed at 05/05/2021 0925 Gross per 24 hour  Intake 236 ml  Output --  Net 236 ml   Filed Weights   05/04/21 2120  Weight: 65.4 kg     Examination:  General exam: Appears calm and comfortable Respiratory system: Clear to auscultation. Respiratory effort normal. Cardiovascular system: S1 & S2 heard, RRR. No murmurs, rubs, gallops or clicks. Gastrointestinal system: Abdomen is nondistended, soft and nontender. No organomegaly or masses felt. Normal bowel sounds heard. Central nervous system: Alert and oriented. No focal neurological deficits. Musculoskeletal: No edema. No calf tenderness Skin: No cyanosis. No rashes Psychiatry: Judgement and insight appear normal. Mood & affect appropriate.     Data Reviewed: I have personally reviewed following labs and imaging studies  CBC Lab Results  Component Value Date   WBC 11.7 (H) 05/05/2021   RBC 3.84 (L) 05/05/2021   HGB 11.7 (L) 05/05/2021   HCT 35.2 (L) 05/05/2021   MCV 91.7 05/05/2021   MCH 30.5 05/05/2021   PLT 255 05/05/2021   MCHC 33.2 05/05/2021   RDW 11.4 (L) 05/05/2021   LYMPHSABS 2.2 05/04/2021   MONOABS 2.0 (H) 05/04/2021   EOSABS 0.1 05/04/2021   BASOSABS 0.1 05/04/2021     Last metabolic panel Lab Results  Component Value Date   NA 138 05/05/2021   K 3.4 (L) 05/05/2021   CL 105 05/05/2021   CO2 25 05/05/2021   BUN 9 05/05/2021   CREATININE 0.79 05/05/2021   GLUCOSE 103 (H) 05/05/2021   GFRNONAA >60 05/05/2021   CALCIUM 9.0 05/05/2021   PROT 7.7 05/04/2021   ALBUMIN 3.8 05/04/2021   BILITOT 1.2 05/04/2021   ALKPHOS 98 05/04/2021   AST 22 05/04/2021   ALT 15 05/04/2021   ANIONGAP 8 05/05/2021    CBG (last 3)  No results for input(s): GLUCAP in  the last 72 hours.   GFR: Estimated Creatinine Clearance: 55.6 mL/min (by C-G formula based on SCr of 0.79 mg/dL).  Coagulation Profile: Recent Labs  Lab 05/03/21 2058  INR 1.0    Recent Results (from the past 240 hour(s))  Urine Culture     Status: Abnormal (Preliminary result)   Collection Time: 05/03/21  8:18 PM   Specimen: In/Out Cath Urine  Result Value Ref Range Status    Specimen Description   Final    IN/OUT CATH URINE Performed at [facility], [address] [place], [place] [address]    Special Requests   Final    NONE Performed at [facility], [address] [place], [place] [address]    Culture (A)  Final    >=100,000 COLONIES/mL ESCHERICHIA COLI CULTURE REINCUBATED FOR BETTER GROWTH SUSCEPTIBILITIES TO FOLLOW Performed at [facility], [place] [address], [place], [place] [address]    Report Status PENDING  Incomplete  Blood Culture (routine x 2)     Status: Abnormal (Preliminary result)   Collection Time: 05/03/21  8:40 PM   Specimen: BLOOD LEFT FOREARM  Result Value Ref Range Status   Specimen Description   Final    BLOOD LEFT FOREARM Performed at [facility], [address] [place], [place] [address]    Special Requests   Final    BOTTLES DRAWN AEROBIC AND ANAEROBIC Blood Culture adequate volume Performed at [facility], [address] [place], [place] [address]    Culture  Setup Time   Final    GRAM NEGATIVE RODS IN BOTH AEROBIC AND ANAEROBIC BOTTLES CRITICAL RESULT CALLED TO, READ BACK BY AND VERIFIED WITH: Bernita Raisin RN [id number] 05/04/21 A BROWNING    Culture (A)  Final    ESCHERICHIA COLI SUSCEPTIBILITIES TO FOLLOW Performed at [facility], [place] [address] [place], [place] [address]    Report Status PENDING  Incomplete  Blood Culture ID Panel (Reflexed)     Status: Abnormal   Collection Time: 05/03/21  8:40 PM  Result Value Ref Range Status   Enterococcus faecalis NOT DETECTED NOT DETECTED Final   Enterococcus Faecium NOT DETECTED NOT DETECTED Final   Listeria monocytogenes NOT DETECTED NOT DETECTED Final   Staphylococcus species NOT DETECTED NOT DETECTED Final   Staphylococcus aureus (BCID) NOT DETECTED NOT DETECTED Final   Staphylococcus epidermidis NOT DETECTED NOT DETECTED Final   Staphylococcus lugdunensis NOT DETECTED NOT DETECTED Final   Streptococcus species  NOT DETECTED NOT DETECTED Final   Streptococcus agalactiae NOT DETECTED NOT DETECTED Final   Streptococcus pneumoniae NOT DETECTED NOT DETECTED Final   Streptococcus pyogenes NOT DETECTED NOT DETECTED Final   A.calcoaceticus-baumannii NOT DETECTED NOT DETECTED Final   Bacteroides fragilis NOT DETECTED NOT DETECTED Final   Enterobacterales DETECTED (A) NOT DETECTED Final    Comment: Enterobacterales represent a large order of gram negative bacteria, not a single organism. CRITICAL RESULT CALLED TO, READ BACK BY AND VERIFIED WITH: Bernita Raisin RN [id number] 05/04/21 A BROWNING    Enterobacter cloacae complex NOT DETECTED NOT DETECTED Final   Escherichia coli DETECTED (A) NOT DETECTED Final    Comment: CRITICAL RESULT CALLED TO, READ BACK BY AND VERIFIED WITH: Bernita Raisin RN [id number] 05/04/21 A BROWNING    Klebsiella aerogenes NOT DETECTED NOT DETECTED Final   Klebsiella oxytoca NOT DETECTED NOT DETECTED Final   Klebsiella pneumoniae NOT DETECTED NOT DETECTED Final   Proteus species  NOT DETECTED NOT DETECTED Final   Salmonella species NOT DETECTED NOT DETECTED Final   Serratia marcescens NOT DETECTED NOT DETECTED Final   Haemophilus influenzae NOT DETECTED NOT DETECTED Final   Neisseria meningitidis NOT DETECTED NOT DETECTED Final   Pseudomonas aeruginosa NOT DETECTED NOT DETECTED Final   Stenotrophomonas maltophilia NOT DETECTED NOT DETECTED Final   Candida albicans NOT DETECTED NOT DETECTED Final   Candida auris NOT DETECTED NOT DETECTED Final   Candida glabrata NOT DETECTED NOT DETECTED Final   Candida krusei NOT DETECTED NOT DETECTED Final   Candida parapsilosis NOT DETECTED NOT DETECTED Final   Candida tropicalis NOT DETECTED NOT DETECTED Final   Cryptococcus neoformans/gattii NOT DETECTED NOT DETECTED Final   CTX-M ESBL NOT DETECTED NOT DETECTED Final   Carbapenem resistance IMP NOT DETECTED NOT DETECTED Final   Carbapenem resistance KPC NOT DETECTED NOT DETECTED Final   Carbapenem resistance  NDM NOT DETECTED NOT DETECTED Final   Carbapenem resist OXA 48 LIKE NOT DETECTED NOT DETECTED Final   Carbapenem resistance VIM NOT DETECTED NOT DETECTED Final    Comment: Performed at [facility], [address] [place], [place] [address]  Blood Culture (routine x 2)     Status: None (Preliminary result)   Collection Time: 05/03/21  8:50 PM   Specimen: Left Antecubital; Blood  Result Value Ref Range Status   Specimen Description   Final    LEFT ANTECUBITAL Performed at [facility], [address] [place], [place] [address]    Special Requests   Final    BOTTLES DRAWN AEROBIC AND ANAEROBIC Blood Culture adequate volume Performed at [facility], [address] [place], [place] [address]    Culture   Final    NO GROWTH 2 DAYS Performed at [facility], [place] [address], [place], [place] [address]    Report Status PENDING  Incomplete  Resp Panel by RT-PCR (Flu A&B, Covid) Nasopharyngeal Swab     Status: None   Collection Time: 05/03/21 10:05 PM   Specimen: Nasopharyngeal Swab; Nasopharyngeal(NP) swabs in vial transport medium  Result Value Ref Range Status   SARS Coronavirus 2 by RT PCR NEGATIVE NEGATIVE Final    Comment: (NOTE) SARS-CoV-2 target nucleic acids are NOT DETECTED.  The SARS-CoV-2 RNA is generally detectable in upper respiratory specimens during the acute phase of infection. The lowest concentration of SARS-CoV-2 viral copies this assay can detect is 138 copies/mL. A negative result does not preclude SARS-Cov-2 infection and should not be used as the sole basis for treatment or other patient management decisions. A negative result may occur with  improper specimen collection/handling, submission of specimen other than nasopharyngeal swab, presence of viral mutation(s) within the areas targeted by this assay, and inadequate number of viral copies(<138 copies/mL). A negative result must be combined with clinical observations,  patient history, and epidemiological information. The expected result is Negative.  Fact Sheet for Patients:  EntrepreneurPulse.com.au  Fact Sheet for Healthcare Providers:  IncredibleEmployment.be  This test is no t yet approved or cleared by the [place] FDA and  has been authorized for detection and/or diagnosis of SARS-CoV-2 by FDA under an Emergency Use Authorization (EUA). This EUA will remain  in effect (meaning this test can be used) for the duration of the COVID-19 declaration under Section 564(b)(1) of the Act, 21 U.S.C.section 360bbb-3(b)(1), unless the authorization is terminated  or revoked sooner.       Influenza A by PCR  NEGATIVE NEGATIVE Final   Influenza B by PCR NEGATIVE NEGATIVE Final    Comment: (NOTE) The Xpert Xpress SARS-CoV-2/FLU/RSV plus assay is intended as an aid in the diagnosis of influenza from Nasopharyngeal swab specimens and should not be used as a sole basis for treatment. Nasal washings and aspirates are unacceptable for Xpert Xpress SARS-CoV-2/FLU/RSV testing.  Fact Sheet for Patients: EntrepreneurPulse.com.au  Fact Sheet for Healthcare Providers: IncredibleEmployment.be  This test is not yet approved or cleared by the [place] FDA and has been authorized for detection and/or diagnosis of SARS-CoV-2 by FDA under an Emergency Use Authorization (EUA). This EUA will remain in effect (meaning this test can be used) for the duration of the COVID-19 declaration under Section 564(b)(1) of the Act, 21 U.S.C. section 360bbb-3(b)(1), unless the authorization is terminated or revoked.  Performed at [facility], [address] [place], [place] [address]   Blood culture (routine x 2)     Status: None (Preliminary result)   Collection Time: 05/04/21  6:27 PM   Specimen: Left Antecubital; Blood  Result Value Ref Range Status   Specimen Description   Final     LEFT ANTECUBITAL Performed at [place] [address], [place], [place] [address]    Special Requests   Final    BOTTLES DRAWN AEROBIC AND ANAEROBIC Blood Culture results may not be optimal due to an inadequate volume of blood received in culture bottles Performed at [place] [address], [place], [place] [address]    Culture   Final    NO GROWTH < 12 HOURS Performed at [place] [address] [place], [place] [address]    Report Status PENDING  Incomplete  Blood culture (routine x 2)     Status: None (Preliminary result)   Collection Time: 05/04/21  6:27 PM   Specimen: Left Antecubital; Blood  Result Value Ref Range Status   Specimen Description   Final    LEFT ANTECUBITAL Performed at [place] [address] [place], [place] [address]    Special Requests   Final    BOTTLES DRAWN AEROBIC AND ANAEROBIC Blood Culture results may not be optimal due to an inadequate volume of blood received in culture bottles Performed at [place] [address] [place], [place] [address]    Culture   Final    NO GROWTH < 12 HOURS Performed at [place] [address] [place], [place] [address]    Report Status PENDING  Incomplete  Resp Panel by RT-PCR (Flu A&B, Covid) Nasopharyngeal Swab     Status: None   Collection Time: 05/04/21 11:54 PM   Specimen: Nasopharyngeal Swab; Nasopharyngeal(NP) swabs in vial transport medium  Result Value Ref Range Status   SARS Coronavirus 2 by RT PCR NEGATIVE NEGATIVE Final    Comment: (NOTE) SARS-CoV-2 target nucleic acids are NOT DETECTED.  The SARS-CoV-2 RNA is generally detectable in upper respiratory specimens during the acute phase of infection. The lowest concentration of SARS-CoV-2 viral copies this assay can detect is 138 copies/mL. A negative result does not preclude SARS-Cov-2 infection and should not be used as the sole  basis for treatment or other patient management decisions. A negative result may occur with  improper specimen collection/handling, submission of specimen other than nasopharyngeal swab, presence of viral mutation(s) within the areas targeted by this assay, and inadequate number of viral copies(<138 copies/mL). A negative result must be  combined with clinical observations, patient history, and epidemiological information. The expected result is Negative.  Fact Sheet for Patients:  BloggerCourse.comhttps://www.fda.gov/media/152166/download  Fact Sheet for Healthcare Providers:  SeriousBroker.ithttps://www.fda.gov/media/152162/download  This test is no t yet approved or cleared by the [place] FDA and  has been authorized for detection and/or diagnosis of SARS-CoV-2 by FDA under an Emergency Use Authorization (EUA). This EUA will remain  in effect (meaning this test can be used) for the duration of the COVID-19 declaration under Section 564(b)(1) of the Act, 21 U.S.C.section 360bbb-3(b)(1), unless the authorization is terminated  or revoked sooner.       Influenza A by PCR NEGATIVE NEGATIVE Final   Influenza B by PCR NEGATIVE NEGATIVE Final    Comment: (NOTE) The Xpert Xpress SARS-CoV-2/FLU/RSV plus assay is intended as an aid in the diagnosis of influenza from Nasopharyngeal swab specimens and should not be used as a sole basis for treatment. Nasal washings and aspirates are unacceptable for Xpert Xpress SARS-CoV-2/FLU/RSV testing.  Fact Sheet for Patients: BloggerCourse.comhttps://www.fda.gov/media/152166/download  Fact Sheet for Healthcare Providers: SeriousBroker.ithttps://www.fda.gov/media/152162/download  This test is not yet approved or cleared by the [place] FDA and has been authorized for detection and/or diagnosis of SARS-CoV-2 by FDA under an Emergency Use Authorization (EUA). This EUA will remain in effect (meaning this test can be used) for the duration of the COVID-19 declaration under Section 564(b)(1) of the Act,  21 U.S.C. section 360bbb-3(b)(1), unless the authorization is terminated or revoked.  Performed at [facility], [address] [place], [place] [id number]         Radiology Studies: CT ABDOMEN PELVIS W CONTRAST  Result Date: 05/03/2021 CLINICAL DATA:  Abdominal pain and fever. EXAM: CT ABDOMEN AND PELVIS WITH CONTRAST TECHNIQUE: Multidetector CT imaging of the abdomen and pelvis was performed using the standard protocol following bolus administration of intravenous contrast. CONTRAST:  100mL OMNIPAQUE IOHEXOL 300 MG/ML  SOLN COMPARISON:  None. FINDINGS: Lower chest: The visualized lung bases are clear. No intra-abdominal free air or free fluid. Hepatobiliary: No focal liver abnormality is seen. No gallstones, gallbladder wall thickening, or biliary dilatation. Pancreas: Unremarkable. No pancreatic ductal dilatation or surrounding inflammatory changes. Spleen: Normal in size without focal abnormality. Adrenals/Urinary Tract: The adrenal glands are unremarkable. Status post prior left nephrectomy. Subcentimeter right renal hypodense lesion is not characterized. There is no hydronephrosis. The visualized ureter is unremarkable. Mild diffuse thickened appearance of the bladder wall with mucosal enhancement. Correlation with urinalysis recommended to exclude cystitis. Stomach/Bowel: There is diffuse colonic diverticulosis without active inflammatory changes. There is no bowel obstruction or active inflammation. Appendectomy. Vascular/Lymphatic: The abdominal aorta and IVC are unremarkable. No portal venous gas. There is no adenopathy. Reproductive: Hysterectomy. Other: None Musculoskeletal: Degenerative changes of the spine and hips. No acute osseous pathology. IMPRESSION: 1. Mild diffuse thickened appearance of the bladder wall with mucosal enhancement. Correlation with urinalysis recommended to exclude cystitis. 2. Colonic diverticulosis. No bowel obstruction. 3. Status post prior  left nephrectomy. Electronically Signed   By: Elgie CollardArash  Radparvar M.D.   On: 05/03/2021 22:04   DG Chest Port 1 View  Result Date: 05/03/2021 CLINICAL DATA:  Questionable sepsis - evaluate for abnormality Fever and chills. EXAM: PORTABLE CHEST 1 VIEW COMPARISON:  Radiograph 08/23/2012.  CT 07/29/2020 FINDINGS: The cardiomediastinal contours are normal. Aortic atherosclerosis. Pulmonary vasculature is normal. No consolidation, pleural effusion, or pneumothorax. No acute osseous abnormalities are seen. IMPRESSION: No acute chest findings. Electronically Signed   By: Ivette LoyalMelanie  Sanford M.D.  On: 05/03/2021 20:30        Scheduled Meds:  amLODipine  10 mg Oral Daily   enoxaparin (LOVENOX) injection  40 mg Subcutaneous Q24H   latanoprost  1 drop Left Eye QHS   pantoprazole  40 mg Oral Daily   rosuvastatin  5 mg Oral Daily   Continuous Infusions:  cefTRIAXone (ROCEPHIN)  IV       LOS: 0 days     Cordelia Poche, MD [facility] 05/05/2021, 2:25 PM  If 7PM-7AM, please contact night-coverage [email or web address]

## 2021-05-05 NOTE — Assessment & Plan Note (Addendum)
-   Continue amlodipine ?

## 2021-05-05 NOTE — Assessment & Plan Note (Addendum)
Continue Crestor 

## 2021-05-05 NOTE — Hospital Course (Signed)
Tiffany Schultz is a 75 y.o. female with a history of hyperlipidemia, hypertension, GERD. Patient presented secondary to abnormal blood culture significant for GNR. Empiric Ceftriaxone initiated on admission.

## 2021-05-05 NOTE — Assessment & Plan Note (Signed)
Secondary to bacteremia/UTI. Improving with antibiotics.

## 2021-05-05 NOTE — Assessment & Plan Note (Addendum)
Recently diagnosed. Previously started empirically on cefadroxil. Urine culture from 11/21 sensitive to first generation cephalosporin.

## 2021-05-05 NOTE — Progress Notes (Signed)
   05/05/21 1225  Mobility  Activity Ambulated in hall  Level of Assistance Independent  Assistive Device None  Distance Ambulated (ft) 500 ft  Mobility Ambulated independently in hallway  Mobility Response Tolerated well  Mobility performed by Mobility specialist  $Mobility charge 1 Mobility   Pt agreeable to mobilizing this afternoon. Ambulated about 585ft in hall, tolerated well. Left pt in bed with call bell at side and visitor present.   Timoteo Expose Mobility Specialist Acute Rehab Services Office: [phone]

## 2021-05-05 NOTE — Assessment & Plan Note (Addendum)
Continue Prilosec

## 2021-05-05 NOTE — Assessment & Plan Note (Addendum)
Secondary to UTI. Started empirically on Ceftriaxone IV. Sensitive to first generation cephalosporin. Transitioned to cefadroxil on discharge to complete 7 days of treatment.

## 2021-05-06 LAB — CULTURE, BLOOD (ROUTINE X 2): Special Requests: ADEQUATE

## 2021-05-06 LAB — URINE CULTURE: Culture: >=100000 — AB

## 2021-05-06 MED ORDER — CEFADROXIL 500 MG PO CAPS: 1000.0000 mg | ORAL_CAPSULE | Freq: Two times a day (BID) | ORAL | 0 refills | Status: AC

## 2021-05-06 NOTE — Plan of Care (Signed)
Pt is aox4, pleasant and cooperative with staff.  Continuing IV Abx per orders. Await final cultures and sensitivities. Pt still endorses cloudy urine but denies any active urinary symptoms.   Problem: Coping: Goal: Level of anxiety will decrease Outcome: Progressing   Problem: Pain Managment: Goal: General experience of comfort will improve Outcome: Progressing   Problem: Safety: Goal: Ability to remain free from injury will improve Outcome: Progressing

## 2021-05-06 NOTE — Discharge Instructions (Signed)
Tiffany Schultz,  You were in the hospital with a blood infection. You will need to continue antibiotics. We discussed continuing the antibiotics you were already prescribed, but that was a lower dose. You will need a higher dose prescription which I sent to your pharmacy.

## 2021-05-06 NOTE — Plan of Care (Signed)
Pt for discharge this afternoon home with family.  AVS printed and reviewed with patient at bedside. Follow up and medications discussed in detail, including dosage change to antibiotic.  Educated to call 9-1-1 incase of emergency or report to nearest ER.  All questions addressed, verbalized understanding.   Problem: Education: Goal: Knowledge of General Education information will improve Description: Including pain rating scale, medication(s)/side effects and non-pharmacologic comfort measures 05/06/2021 1214 by Macon Large, RN Outcome: Adequate for Discharge

## 2021-05-06 NOTE — Discharge Summary (Signed)
Physician Discharge Summary  Tiffany Schultz [id number] DOB: [birth year] DOA: 05/04/2021  PCP: Francesca Oman, DO  Admit date: 05/04/2021 Discharge date: 05/06/2021  Admitted From: Home Disposition: Home  Recommendations for Outpatient Follow-up:  Follow up with PCP in 1 week Please obtain BMP/CBC in one week Please follow up on the following pending results: None  Home Health: None Equipment/Devices: None  Discharge Condition: Stable CODE STATUS: Full code Diet recommendation: Heart healthy   Brief/Interim Summary:  Admission HPI written by Gean Birchwood, MD   HPI: Tiffany Schultz is a 75 y.o. female with history of hypertension and hyperlipidemia had come to the ER yesterday at med center with complaints of having dysuria subjective feeling of fever chills suprapubic pain and also right flank pain ongoing for a week.  In the ER patient had a CT abdomen pelvis shows features concerning for cystitis UA is consistent with UTI.  Patient also febrile with labs showing leukocytosis.  Had blood cultures drawn patient was sent home after IV dose of antibiotics.  Blood cultures grew E. coli and patient was advised to come to the ER.   Hospital course:  * E coli bacteremia Secondary to UTI. Started empirically on Ceftriaxone IV. Sensitive to first generation cephalosporin. Transitioned to cefadroxil on discharge to complete 7 days of treatment.  Leukocytosis Secondary to bacteremia/UTI. Improving with antibiotics.  Hyperlipidemia Continue Crestor  GERD (gastroesophageal reflux disease) Continue Prilosec  Essential hypertension Continue amlodipine  Acute cystitis without hematuria Recently diagnosed. Previously started empirically on cefadroxil. Urine culture from 11/21 sensitive to first generation cephalosporin.    Discharge Diagnoses:  Principal Problem:   E coli bacteremia Active Problems:   Acute cystitis without hematuria   Essential hypertension   GERD  (gastroesophageal reflux disease)   Hyperlipidemia   Leukocytosis   Bacteremia    Discharge Instructions  Discharge Instructions     Call MD for:  temperature >100.4   Complete by: As directed       Allergies as of 05/06/2021       Reactions   Morphine And Related    Other reaction(s): Confusion (intolerance), Other (See Comments) Sluggish and confused   Penicillins Swelling   Tramadol    Other reaction(s): Confusion (intolerance), Other (See Comments) Sluggish and confused        Medication List     STOP taking these medications    naproxen 500 MG tablet Commonly known as: NAPROSYN       TAKE these medications    amLODipine 10 MG tablet Commonly known as: NORVASC Take 10 mg by mouth daily.   cefadroxil 500 MG capsule Commonly known as: DURICEF Take 2 capsules (1,000 mg total) by mouth 2 (two) times daily for 5 days. What changed: how much to take   cholecalciferol 25 MCG (1000 UNIT) tablet Commonly known as: VITAMIN D3 Take 3,000 Units by mouth daily.   diclofenac Sodium 1 % Gel Commonly known as: VOLTAREN 2 g daily as needed (pain).   FLAXSEED OIL PO Take 1 tablet by mouth daily.   furosemide 20 MG tablet Commonly known as: LASIX Take 20 mg by mouth daily.   ketorolac 0.5 % ophthalmic solution Commonly known as: ACULAR Place 1 drop into the right eye 4 (four) times daily.   latanoprost 0.005 % ophthalmic solution Commonly known as: XALATAN Place 1 drop into the left eye at bedtime.   moxifloxacin 0.5 % ophthalmic solution Commonly known as: VIGAMOX Place 1 drop into the right eye in  the morning, at noon, in the evening, and at bedtime.   omeprazole 40 MG capsule Commonly known as: PRILOSEC Take 40 mg by mouth daily.   ondansetron 4 MG disintegrating tablet Commonly known as: Zofran ODT Take 1 tablet (4 mg total) by mouth every 8 (eight) hours as needed for nausea or vomiting.   prednisoLONE acetate 1 % ophthalmic  suspension Commonly known as: PRED FORTE Place 1 drop into the right eye 4 (four) times daily.   rosuvastatin 5 MG tablet Commonly known as: CRESTOR Take 5 mg by mouth daily.        Allergies  Allergen Reactions   Morphine And Related     Other reaction(s): Confusion (intolerance), Other (See Comments) Sluggish and confused   Penicillins Swelling   Tramadol     Other reaction(s): Confusion (intolerance), Other (See Comments) Sluggish and confused    Consultations: Infectious disease (phone discussion)   Procedures/Studies: CT ABDOMEN PELVIS W CONTRAST  Result Date: 05/03/2021 CLINICAL DATA:  Abdominal pain and fever. EXAM: CT ABDOMEN AND PELVIS WITH CONTRAST TECHNIQUE: Multidetector CT imaging of the abdomen and pelvis was performed using the standard protocol following bolus administration of intravenous contrast. CONTRAST:  149mL OMNIPAQUE IOHEXOL 300 MG/ML  SOLN COMPARISON:  None. FINDINGS: Lower chest: The visualized lung bases are clear. No intra-abdominal free air or free fluid. Hepatobiliary: No focal liver abnormality is seen. No gallstones, gallbladder wall thickening, or biliary dilatation. Pancreas: Unremarkable. No pancreatic ductal dilatation or surrounding inflammatory changes. Spleen: Normal in size without focal abnormality. Adrenals/Urinary Tract: The adrenal glands are unremarkable. Status post prior left nephrectomy. Subcentimeter right renal hypodense lesion is not characterized. There is no hydronephrosis. The visualized ureter is unremarkable. Mild diffuse thickened appearance of the bladder wall with mucosal enhancement. Correlation with urinalysis recommended to exclude cystitis. Stomach/Bowel: There is diffuse colonic diverticulosis without active inflammatory changes. There is no bowel obstruction or active inflammation. Appendectomy. Vascular/Lymphatic: The abdominal aorta and IVC are unremarkable. No portal venous gas. There is no adenopathy. Reproductive:  Hysterectomy. Other: None Musculoskeletal: Degenerative changes of the spine and hips. No acute osseous pathology. IMPRESSION: 1. Mild diffuse thickened appearance of the bladder wall with mucosal enhancement. Correlation with urinalysis recommended to exclude cystitis. 2. Colonic diverticulosis. No bowel obstruction. 3. Status post prior left nephrectomy. Electronically Signed   By: Anner Crete M.D.   On: 05/03/2021 22:04   DG Chest Port 1 View  Result Date: 05/03/2021 CLINICAL DATA:  Questionable sepsis - evaluate for abnormality Fever and chills. EXAM: PORTABLE CHEST 1 VIEW COMPARISON:  Radiograph 08/23/2012.  CT 07/29/2020 FINDINGS: The cardiomediastinal contours are normal. Aortic atherosclerosis. Pulmonary vasculature is normal. No consolidation, pleural effusion, or pneumothorax. No acute osseous abnormalities are seen. IMPRESSION: No acute chest findings. Electronically Signed   By: Keith Rake M.D.   On: 05/03/2021 20:30     Subjective: No issues overnight. Afebrile over the last 24 hours.  Discharge Exam: Vitals:   05/05/21 2116 05/06/21 0403  BP: 114/76 120/79  Pulse: 86 83  Resp: 20 20  Temp: 98.6 F (37 C) 98.4 F (36.9 C)  SpO2: 95% 94%   Vitals:   05/05/21 0907 05/05/21 1253 05/05/21 2116 05/06/21 0403  BP: 126/72 129/83 114/76 120/79  Pulse: 89 93 86 83  Resp: 18 18 20 20   Temp: 97.8 F (36.6 C) 98.6 F (37 C) 98.6 F (37 C) 98.4 F (36.9 C)  TempSrc: Oral Oral Oral Oral  SpO2: 95% 97% 95% 94%  Weight:  Height:        General: Pt is alert, awake, not in acute distress Cardiovascular: RRR, S1/S2 +, no rubs, no gallops Respiratory: CTA bilaterally, no wheezing, no rhonchi Abdominal: Soft, NT, ND, bowel sounds + Extremities: no edema, no cyanosis    The results of significant diagnostics from this hospitalization (including imaging, microbiology, ancillary and laboratory) are listed below for reference.     Microbiology: Recent Results  (from the past 240 hour(s))  Urine Culture     Status: Abnormal   Collection Time: 05/03/21  8:18 PM   Specimen: In/Out Cath Urine  Result Value Ref Range Status   Specimen Description   Final    IN/OUT CATH URINE Performed at [facility], [address] [place], [place] [address]    Special Requests   Final    NONE Performed at [facility], [address] [place], [place] [address]    Culture >=100,000 COLONIES/mL ESCHERICHIA COLI (A)  Final   Report Status 05/06/2021 FINAL  Final   Organism ID, Bacteria ESCHERICHIA COLI (A)  Final      Susceptibility   Escherichia coli - MIC*    AMPICILLIN >=32 RESISTANT Resistant     CEFAZOLIN 8 SENSITIVE Sensitive     CEFEPIME <=0.12 SENSITIVE Sensitive     CEFTRIAXONE <=0.25 SENSITIVE Sensitive     CIPROFLOXACIN <=0.25 SENSITIVE Sensitive     GENTAMICIN <=1 SENSITIVE Sensitive     IMIPENEM <=0.25 SENSITIVE Sensitive     NITROFURANTOIN <=16 SENSITIVE Sensitive     TRIMETH/SULFA <=20 SENSITIVE Sensitive     AMPICILLIN/SULBACTAM >=32 RESISTANT Resistant     PIP/TAZO 64 INTERMEDIATE Intermediate     * >=100,000 COLONIES/mL ESCHERICHIA COLI  Blood Culture (routine x 2)     Status: Abnormal   Collection Time: 05/03/21  8:40 PM   Specimen: BLOOD LEFT FOREARM  Result Value Ref Range Status   Specimen Description   Final    BLOOD LEFT FOREARM Performed at [facility], [address] [place], [place] [address]    Special Requests   Final    BOTTLES DRAWN AEROBIC AND ANAEROBIC Blood Culture adequate volume Performed at [facility], [address] [place], [place] [address]    Culture  Setup Time   Final    GRAM NEGATIVE RODS IN BOTH AEROBIC AND ANAEROBIC BOTTLES CRITICAL RESULT CALLED TO, READ BACK BY AND VERIFIED WITH: Bernita Raisin RN [id number] 05/04/21 A BROWNING Performed at [facility], [place] [address] [place], [place] [address]    Culture ESCHERICHIA COLI (A)  Final   Report Status  05/06/2021 FINAL  Final   Organism ID, Bacteria ESCHERICHIA COLI  Final      Susceptibility   Escherichia coli - MIC*    AMPICILLIN >=32 RESISTANT Resistant     CEFAZOLIN <=4 SENSITIVE Sensitive     CEFEPIME <=0.12 SENSITIVE Sensitive     CEFTAZIDIME <=1 SENSITIVE Sensitive     CEFTRIAXONE <=0.25 SENSITIVE Sensitive     CIPROFLOXACIN <=0.25 SENSITIVE Sensitive     GENTAMICIN <=1 SENSITIVE Sensitive     IMIPENEM <=0.25 SENSITIVE Sensitive     TRIMETH/SULFA <=20 SENSITIVE Sensitive     AMPICILLIN/SULBACTAM >=32 RESISTANT Resistant     PIP/TAZO <=4 SENSITIVE Sensitive     * ESCHERICHIA COLI  Blood Culture ID Panel (Reflexed)     Status: Abnormal   Collection Time: 05/03/21  8:40 PM  Result Value Ref  Range Status   Enterococcus faecalis NOT DETECTED NOT DETECTED Final   Enterococcus Faecium NOT DETECTED NOT DETECTED Final   Listeria monocytogenes NOT DETECTED NOT DETECTED Final   Staphylococcus species NOT DETECTED NOT DETECTED Final   Staphylococcus aureus (BCID) NOT DETECTED NOT DETECTED Final   Staphylococcus epidermidis NOT DETECTED NOT DETECTED Final   Staphylococcus lugdunensis NOT DETECTED NOT DETECTED Final   Streptococcus species NOT DETECTED NOT DETECTED Final   Streptococcus agalactiae NOT DETECTED NOT DETECTED Final   Streptococcus pneumoniae NOT DETECTED NOT DETECTED Final   Streptococcus pyogenes NOT DETECTED NOT DETECTED Final   A.calcoaceticus-baumannii NOT DETECTED NOT DETECTED Final   Bacteroides fragilis NOT DETECTED NOT DETECTED Final   Enterobacterales DETECTED (A) NOT DETECTED Final    Comment: Enterobacterales represent a large order of gram negative bacteria, not a single organism. CRITICAL RESULT CALLED TO, READ BACK BY AND VERIFIED WITH: Bernita Raisin RN [id number] 05/04/21 A BROWNING    Enterobacter cloacae complex NOT DETECTED NOT DETECTED Final   Escherichia coli DETECTED (A) NOT DETECTED Final    Comment: CRITICAL RESULT CALLED TO, READ BACK BY AND VERIFIED  WITH: Bernita Raisin RN [phone] 05/04/21 A BROWNING    Klebsiella aerogenes NOT DETECTED NOT DETECTED Final   Klebsiella oxytoca NOT DETECTED NOT DETECTED Final   Klebsiella pneumoniae NOT DETECTED NOT DETECTED Final   Proteus species NOT DETECTED NOT DETECTED Final   Salmonella species NOT DETECTED NOT DETECTED Final   Serratia marcescens NOT DETECTED NOT DETECTED Final   Haemophilus influenzae NOT DETECTED NOT DETECTED Final   Neisseria meningitidis NOT DETECTED NOT DETECTED Final   Pseudomonas aeruginosa NOT DETECTED NOT DETECTED Final   Stenotrophomonas maltophilia NOT DETECTED NOT DETECTED Final   Candida albicans NOT DETECTED NOT DETECTED Final   Candida auris NOT DETECTED NOT DETECTED Final   Candida glabrata NOT DETECTED NOT DETECTED Final   Candida krusei NOT DETECTED NOT DETECTED Final   Candida parapsilosis NOT DETECTED NOT DETECTED Final   Candida tropicalis NOT DETECTED NOT DETECTED Final   Cryptococcus neoformans/gattii NOT DETECTED NOT DETECTED Final   CTX-M ESBL NOT DETECTED NOT DETECTED Final   Carbapenem resistance IMP NOT DETECTED NOT DETECTED Final   Carbapenem resistance KPC NOT DETECTED NOT DETECTED Final   Carbapenem resistance NDM NOT DETECTED NOT DETECTED Final   Carbapenem resist OXA 48 LIKE NOT DETECTED NOT DETECTED Final   Carbapenem resistance VIM NOT DETECTED NOT DETECTED Final    Comment: Performed at [facility], [address], [place], [place] [address]  Blood Culture (routine x 2)     Status: None (Preliminary result)   Collection Time: 05/03/21  8:50 PM   Specimen: Left Antecubital; Blood  Result Value Ref Range Status   Specimen Description   Final    LEFT ANTECUBITAL Performed at [facility], [address] [place], [place] [address]    Special Requests   Final    BOTTLES DRAWN AEROBIC AND ANAEROBIC Blood Culture adequate volume Performed at [facility], [address] [place], [place] [address]    Culture   Final     NO GROWTH 2 DAYS Performed at [facility], [place] [address], [place], [place] [address]    Report Status PENDING  Incomplete  Resp Panel by RT-PCR (Flu A&B, Covid) Nasopharyngeal Swab     Status: None   Collection Time: 05/03/21 10:05 PM   Specimen: Nasopharyngeal Swab; Nasopharyngeal(NP) swabs in vial transport medium  Result Value Ref Range Status   SARS Coronavirus 2 by RT PCR NEGATIVE NEGATIVE Final    Comment: (NOTE) SARS-CoV-2 target nucleic acids are NOT DETECTED.  The SARS-CoV-2 RNA is generally detectable in upper respiratory specimens during the acute phase of infection. The lowest concentration of SARS-CoV-2 viral copies this assay can detect is 138 copies/mL. A negative result does not preclude SARS-Cov-2 infection and should not be used as the sole basis for treatment or other patient management decisions. A negative result may occur with  improper specimen collection/handling, submission of specimen other than nasopharyngeal swab, presence of viral mutation(s) within the areas targeted by this assay, and inadequate number of viral copies(<138 copies/mL). A negative result must be combined with clinical observations, patient history, and epidemiological information. The expected result is Negative.  Fact Sheet for Patients:  BloggerCourse.comhttps://www.fda.gov/media/152166/download  Fact Sheet for Healthcare Providers:  SeriousBroker.ithttps://www.fda.gov/media/152162/download  This test is no t yet approved or cleared by the [place] FDA and  has been authorized for detection and/or diagnosis of SARS-CoV-2 by FDA under an Emergency Use Authorization (EUA). This EUA will remain  in effect (meaning this test can be used) for the duration of the COVID-19 declaration under Section 564(b)(1) of the Act, 21 U.S.C.section 360bbb-3(b)(1), unless the authorization is terminated  or revoked sooner.       Influenza A by PCR NEGATIVE NEGATIVE Final   Influenza B by PCR NEGATIVE NEGATIVE Final     Comment: (NOTE) The Xpert Xpress SARS-CoV-2/FLU/RSV plus assay is intended as an aid in the diagnosis of influenza from Nasopharyngeal swab specimens and should not be used as a sole basis for treatment. Nasal washings and aspirates are unacceptable for Xpert Xpress SARS-CoV-2/FLU/RSV testing.  Fact Sheet for Patients: BloggerCourse.comhttps://www.fda.gov/media/152166/download  Fact Sheet for Healthcare Providers: SeriousBroker.ithttps://www.fda.gov/media/152162/download  This test is not yet approved or cleared by the [place] FDA and has been authorized for detection and/or diagnosis of SARS-CoV-2 by FDA under an Emergency Use Authorization (EUA). This EUA will remain in effect (meaning this test can be used) for the duration of the COVID-19 declaration under Section 564(b)(1) of the Act, 21 U.S.C. section 360bbb-3(b)(1), unless the authorization is terminated or revoked.  Performed at [facility], [address] [place], [place] [address]   Blood culture (routine x 2)     Status: None (Preliminary result)   Collection Time: 05/04/21  6:27 PM   Specimen: Left Antecubital; Blood  Result Value Ref Range Status   Specimen Description   Final    LEFT ANTECUBITAL Performed at [facility], [address] [place], [place] [address]    Special Requests   Final    BOTTLES DRAWN AEROBIC AND ANAEROBIC Blood Culture results may not be optimal due to an inadequate volume of blood received in culture bottles Performed at [facility], [address] [place], [place] [address]    Culture   Final    NO GROWTH < 12 HOURS Performed at [facility], [address] [place], [place] [address]    Report Status PENDING  Incomplete  Blood culture (routine x 2)     Status: None (Preliminary result)   Collection Time: 05/04/21  6:27 PM   Specimen: Left Antecubital; Blood  Result Value Ref Range Status   Specimen Description   Final    LEFT  ANTECUBITAL Performed at [facility], [address] [place], [place] [address]    Special Requests  Final    BOTTLES DRAWN AEROBIC AND ANAEROBIC Blood Culture results may not be optimal due to an inadequate volume of blood received in culture bottles Performed at [facility], [place] [address] [place], [place] [address]    Culture   Final    NO GROWTH < 12 HOURS Performed at [place] [address] [place], [place] [address]    Report Status PENDING  Incomplete  Resp Panel by RT-PCR (Flu A&B, Covid) Nasopharyngeal Swab     Status: None   Collection Time: 05/04/21 11:54 PM   Specimen: Nasopharyngeal Swab; Nasopharyngeal(NP) swabs in vial transport medium  Result Value Ref Range Status   SARS Coronavirus 2 by RT PCR NEGATIVE NEGATIVE Final    Comment: (NOTE) SARS-CoV-2 target nucleic acids are NOT DETECTED.  The SARS-CoV-2 RNA is generally detectable in upper respiratory specimens during the acute phase of infection. The lowest concentration of SARS-CoV-2 viral copies this assay can detect is 138 copies/mL. A negative result does not preclude SARS-Cov-2 infection and should not be used as the sole basis for treatment or other patient management decisions. A negative result may occur with  improper specimen collection/handling, submission of specimen other than nasopharyngeal swab, presence of viral mutation(s) within the areas targeted by this assay, and inadequate number of viral copies(<138 copies/mL). A negative result must be combined with clinical observations, patient history, and epidemiological information. The expected result is Negative.  Fact Sheet for Patients:  EntrepreneurPulse.com.au  Fact Sheet for Healthcare Providers:  IncredibleEmployment.be  This test is no t yet approved or cleared by the [place] FDA and  has been authorized for detection and/or diagnosis of  SARS-CoV-2 by FDA under an Emergency Use Authorization (EUA). This EUA will remain  in effect (meaning this test can be used) for the duration of the COVID-19 declaration under Section 564(b)(1) of the Act, 21 U.S.C.section 360bbb-3(b)(1), unless the authorization is terminated  or revoked sooner.       Influenza A by PCR NEGATIVE NEGATIVE Final   Influenza B by PCR NEGATIVE NEGATIVE Final    Comment: (NOTE) The Xpert Xpress SARS-CoV-2/FLU/RSV plus assay is intended as an aid in the diagnosis of influenza from Nasopharyngeal swab specimens and should not be used as a sole basis for treatment. Nasal washings and aspirates are unacceptable for Xpert Xpress SARS-CoV-2/FLU/RSV testing.  Fact Sheet for Patients: EntrepreneurPulse.com.au  Fact Sheet for Healthcare Providers: IncredibleEmployment.be  This test is not yet approved or cleared by the [place] FDA and has been authorized for detection and/or diagnosis of SARS-CoV-2 by FDA under an Emergency Use Authorization (EUA). This EUA will remain in effect (meaning this test can be used) for the duration of the COVID-19 declaration under Section 564(b)(1) of the Act, 21 U.S.C. section 360bbb-3(b)(1), unless the authorization is terminated or revoked.  Performed at [facility], [place] [address], [place], [place] [address]      Labs: BNP (last 3 results) No results for input(s): BNP in the last 8760 hours. Basic Metabolic Panel: Recent Labs  Lab 05/03/21 1946 05/04/21 1827 05/05/21 0506  NA 136 137 138  K 3.1* 3.7 3.4*  CL 99 102 105  CO2 26 25 25   GLUCOSE 100* 96 103*  BUN 17 11 9   CREATININE 1.00 0.88 0.79  CALCIUM 9.3 8.9 9.0   Liver Function Tests: Recent Labs  Lab 05/03/21 1946 05/04/21 1827  AST 15 22  ALT 15 15  ALKPHOS 116 98  BILITOT  0.6 1.2  PROT 8.3* 7.7  ALBUMIN 4.5 3.8   Recent Labs  Lab 05/03/21 1946  LIPASE 33   No results for  input(s): AMMONIA in the last 168 hours. CBC: Recent Labs  Lab 05/03/21 1946 05/04/21 1827 05/05/21 0506  WBC 18.6* 15.4* 11.7*  NEUTROABS  --  11.1*  --   HGB 13.8 12.1 11.7*  HCT 41.4 36.6 35.2*  MCV 91.4 91.5 91.7  PLT 302 272 255   Cardiac Enzymes: No results for input(s): CKTOTAL, CKMB, CKMBINDEX, TROPONINI in the last 168 hours. BNP: Invalid input(s): POCBNP CBG: No results for input(s): GLUCAP in the last 168 hours. D-Dimer No results for input(s): DDIMER in the last 72 hours. Hgb A1c No results for input(s): HGBA1C in the last 72 hours. Lipid Profile No results for input(s): CHOL, HDL, LDLCALC, TRIG, CHOLHDL, LDLDIRECT in the last 72 hours. Thyroid function studies No results for input(s): TSH, T4TOTAL, T3FREE, THYROIDAB in the last 72 hours.  Invalid input(s): FREET3 Anemia work up No results for input(s): VITAMINB12, FOLATE, FERRITIN, TIBC, IRON, RETICCTPCT in the last 72 hours. Urinalysis    Component Value Date/Time   COLORURINE YELLOW 05/03/2021 2016   APPEARANCEUR CLOUDY (A) 05/03/2021 2016   LABSPEC 1.020 05/03/2021 2016   PHURINE 7.0 05/03/2021 2016   GLUCOSEU NEGATIVE 05/03/2021 2016   HGBUR MODERATE (A) 05/03/2021 2016   BILIRUBINUR NEGATIVE 05/03/2021 2016   KETONESUR NEGATIVE 05/03/2021 2016   PROTEINUR 30 (A) 05/03/2021 2016   NITRITE NEGATIVE 05/03/2021 2016   LEUKOCYTESUR LARGE (A) 05/03/2021 2016   Sepsis Labs Invalid input(s): PROCALCITONIN,  WBC,  LACTICIDVEN Microbiology Recent Results (from the past 240 hour(s))  Urine Culture     Status: Abnormal   Collection Time: 05/03/21  8:18 PM   Specimen: In/Out Cath Urine  Result Value Ref Range Status   Specimen Description   Final    IN/OUT CATH URINE Performed at [facility], [address] [place], [place] [address]    Special Requests   Final    NONE Performed at [facility], [address] [facility] [place], [place] [address]    Culture >=100,000 COLONIES/mL  ESCHERICHIA COLI (A)  Final   Report Status 05/06/2021 FINAL  Final   Organism ID, Bacteria ESCHERICHIA COLI (A)  Final      Susceptibility   Escherichia coli - MIC*    AMPICILLIN >=32 RESISTANT Resistant     CEFAZOLIN 8 SENSITIVE Sensitive     CEFEPIME <=0.12 SENSITIVE Sensitive     CEFTRIAXONE <=0.25 SENSITIVE Sensitive     CIPROFLOXACIN <=0.25 SENSITIVE Sensitive     GENTAMICIN <=1 SENSITIVE Sensitive     IMIPENEM <=0.25 SENSITIVE Sensitive     NITROFURANTOIN <=16 SENSITIVE Sensitive     TRIMETH/SULFA <=20 SENSITIVE Sensitive     AMPICILLIN/SULBACTAM >=32 RESISTANT Resistant     PIP/TAZO 64 INTERMEDIATE Intermediate     * >=100,000 COLONIES/mL ESCHERICHIA COLI  Blood Culture (routine x 2)     Status: Abnormal   Collection Time: 05/03/21  8:40 PM   Specimen: BLOOD LEFT FOREARM  Result Value Ref Range Status   Specimen Description   Final    BLOOD LEFT FOREARM Performed at [facility], [address] [place], [place] [address]    Special Requests   Final    BOTTLES DRAWN AEROBIC AND ANAEROBIC Blood Culture adequate volume Performed at [facility], [address], [place], [place] [address]  Culture  Setup Time   Final    GRAM NEGATIVE RODS IN BOTH AEROBIC AND ANAEROBIC BOTTLES CRITICAL RESULT CALLED TO, READ BACK BY AND VERIFIED WITH: Bernita Raisin RN [id number] 05/04/21 A BROWNING Performed at [facility], [place] [address] [place], [place] [address]    Culture ESCHERICHIA COLI (A)  Final   Report Status 05/06/2021 FINAL  Final   Organism ID, Bacteria ESCHERICHIA COLI  Final      Susceptibility   Escherichia coli - MIC*    AMPICILLIN >=32 RESISTANT Resistant     CEFAZOLIN <=4 SENSITIVE Sensitive     CEFEPIME <=0.12 SENSITIVE Sensitive     CEFTAZIDIME <=1 SENSITIVE Sensitive     CEFTRIAXONE <=0.25 SENSITIVE Sensitive     CIPROFLOXACIN <=0.25 SENSITIVE Sensitive     GENTAMICIN <=1 SENSITIVE Sensitive     IMIPENEM <=0.25 SENSITIVE Sensitive      TRIMETH/SULFA <=20 SENSITIVE Sensitive     AMPICILLIN/SULBACTAM >=32 RESISTANT Resistant     PIP/TAZO <=4 SENSITIVE Sensitive     * ESCHERICHIA COLI  Blood Culture ID Panel (Reflexed)     Status: Abnormal   Collection Time: 05/03/21  8:40 PM  Result Value Ref Range Status   Enterococcus faecalis NOT DETECTED NOT DETECTED Final   Enterococcus Faecium NOT DETECTED NOT DETECTED Final   Listeria monocytogenes NOT DETECTED NOT DETECTED Final   Staphylococcus species NOT DETECTED NOT DETECTED Final   Staphylococcus aureus (BCID) NOT DETECTED NOT DETECTED Final   Staphylococcus epidermidis NOT DETECTED NOT DETECTED Final   Staphylococcus lugdunensis NOT DETECTED NOT DETECTED Final   Streptococcus species NOT DETECTED NOT DETECTED Final   Streptococcus agalactiae NOT DETECTED NOT DETECTED Final   Streptococcus pneumoniae NOT DETECTED NOT DETECTED Final   Streptococcus pyogenes NOT DETECTED NOT DETECTED Final   A.calcoaceticus-baumannii NOT DETECTED NOT DETECTED Final   Bacteroides fragilis NOT DETECTED NOT DETECTED Final   Enterobacterales DETECTED (A) NOT DETECTED Final    Comment: Enterobacterales represent a large order of gram negative bacteria, not a single organism. CRITICAL RESULT CALLED TO, READ BACK BY AND VERIFIED WITH: Bernita Raisin RN [id number] 05/04/21 A BROWNING    Enterobacter cloacae complex NOT DETECTED NOT DETECTED Final   Escherichia coli DETECTED (A) NOT DETECTED Final    Comment: CRITICAL RESULT CALLED TO, READ BACK BY AND VERIFIED WITH: Bernita Raisin RN [phone] 05/04/21 A BROWNING    Klebsiella aerogenes NOT DETECTED NOT DETECTED Final   Klebsiella oxytoca NOT DETECTED NOT DETECTED Final   Klebsiella pneumoniae NOT DETECTED NOT DETECTED Final   Proteus species NOT DETECTED NOT DETECTED Final   Salmonella species NOT DETECTED NOT DETECTED Final   Serratia marcescens NOT DETECTED NOT DETECTED Final   Haemophilus influenzae NOT DETECTED NOT DETECTED Final   Neisseria meningitidis NOT  DETECTED NOT DETECTED Final   Pseudomonas aeruginosa NOT DETECTED NOT DETECTED Final   Stenotrophomonas maltophilia NOT DETECTED NOT DETECTED Final   Candida albicans NOT DETECTED NOT DETECTED Final   Candida auris NOT DETECTED NOT DETECTED Final   Candida glabrata NOT DETECTED NOT DETECTED Final   Candida krusei NOT DETECTED NOT DETECTED Final   Candida parapsilosis NOT DETECTED NOT DETECTED Final   Candida tropicalis NOT DETECTED NOT DETECTED Final   Cryptococcus neoformans/gattii NOT DETECTED NOT DETECTED Final   CTX-M ESBL NOT DETECTED NOT DETECTED Final   Carbapenem resistance IMP NOT DETECTED NOT DETECTED Final   Carbapenem resistance KPC NOT DETECTED NOT DETECTED Final   Carbapenem resistance NDM NOT DETECTED NOT  DETECTED Final   Carbapenem resist OXA 48 LIKE NOT DETECTED NOT DETECTED Final   Carbapenem resistance VIM NOT DETECTED NOT DETECTED Final    Comment: Performed at [facility], [place] [address], [place], [place] [address]  Blood Culture (routine x 2)     Status: None (Preliminary result)   Collection Time: 05/03/21  8:50 PM   Specimen: Left Antecubital; Blood  Result Value Ref Range Status   Specimen Description   Final    LEFT ANTECUBITAL Performed at [facility], [address] [place], [place] [address]    Special Requests   Final    BOTTLES DRAWN AEROBIC AND ANAEROBIC Blood Culture adequate volume Performed at [facility], [address] [place], [place] [address]    Culture   Final    NO GROWTH 2 DAYS Performed at [facility], [place] [address], [place], [place] [address]    Report Status PENDING  Incomplete  Resp Panel by RT-PCR (Flu A&B, Covid) Nasopharyngeal Swab     Status: None   Collection Time: 05/03/21 10:05 PM   Specimen: Nasopharyngeal Swab; Nasopharyngeal(NP) swabs in vial transport medium  Result Value Ref Range Status   SARS Coronavirus 2 by RT PCR NEGATIVE NEGATIVE Final    Comment: (NOTE) SARS-CoV-2 target  nucleic acids are NOT DETECTED.  The SARS-CoV-2 RNA is generally detectable in upper respiratory specimens during the acute phase of infection. The lowest concentration of SARS-CoV-2 viral copies this assay can detect is 138 copies/mL. A negative result does not preclude SARS-Cov-2 infection and should not be used as the sole basis for treatment or other patient management decisions. A negative result may occur with  improper specimen collection/handling, submission of specimen other than nasopharyngeal swab, presence of viral mutation(s) within the areas targeted by this assay, and inadequate number of viral copies(<138 copies/mL). A negative result must be combined with clinical observations, patient history, and epidemiological information. The expected result is Negative.  Fact Sheet for Patients:  EntrepreneurPulse.com.au  Fact Sheet for Healthcare Providers:  IncredibleEmployment.be  This test is no t yet approved or cleared by the [place] FDA and  has been authorized for detection and/or diagnosis of SARS-CoV-2 by FDA under an Emergency Use Authorization (EUA). This EUA will remain  in effect (meaning this test can be used) for the duration of the COVID-19 declaration under Section 564(b)(1) of the Act, 21 U.S.C.section 360bbb-3(b)(1), unless the authorization is terminated  or revoked sooner.       Influenza A by PCR NEGATIVE NEGATIVE Final   Influenza B by PCR NEGATIVE NEGATIVE Final    Comment: (NOTE) The Xpert Xpress SARS-CoV-2/FLU/RSV plus assay is intended as an aid in the diagnosis of influenza from Nasopharyngeal swab specimens and should not be used as a sole basis for treatment. Nasal washings and aspirates are unacceptable for Xpert Xpress SARS-CoV-2/FLU/RSV testing.  Fact Sheet for Patients: EntrepreneurPulse.com.au  Fact Sheet for Healthcare  Providers: IncredibleEmployment.be  This test is not yet approved or cleared by the [place] FDA and has been authorized for detection and/or diagnosis of SARS-CoV-2 by FDA under an Emergency Use Authorization (EUA). This EUA will remain in effect (meaning this test can be used) for the duration of the COVID-19 declaration under Section 564(b)(1) of the Act, 21 U.S.C. section 360bbb-3(b)(1), unless the authorization is terminated or revoked.  Performed at [facility], [address] [place], [place] [address]   Blood culture (  routine x 2)     Status: None (Preliminary result)   Collection Time: 05/04/21  6:27 PM   Specimen: Left Antecubital; Blood  Result Value Ref Range Status   Specimen Description   Final    LEFT ANTECUBITAL Performed at [place] [address] [place], [place] [address]    Special Requests   Final    BOTTLES DRAWN AEROBIC AND ANAEROBIC Blood Culture results may not be optimal due to an inadequate volume of blood received in culture bottles Performed at [place] [address], [place], [place] [address]    Culture   Final    NO GROWTH < 12 HOURS Performed at [place] [address], [place], [place] [address]    Report Status PENDING  Incomplete  Blood culture (routine x 2)     Status: None (Preliminary result)   Collection Time: 05/04/21  6:27 PM   Specimen: Left Antecubital; Blood  Result Value Ref Range Status   Specimen Description   Final    LEFT ANTECUBITAL Performed at [place] [address] [place], [place] [address]    Special Requests   Final    BOTTLES DRAWN AEROBIC AND ANAEROBIC Blood Culture results may not be optimal due to an inadequate volume of blood received in culture bottles Performed at [facility] [address] [place], [place] [address]    Culture   Final    NO GROWTH < 12  HOURS Performed at [place] [address] [place], [place] [address]    Report Status PENDING  Incomplete  Resp Panel by RT-PCR (Flu A&B, Covid) Nasopharyngeal Swab     Status: None   Collection Time: 05/04/21 11:54 PM   Specimen: Nasopharyngeal Swab; Nasopharyngeal(NP) swabs in vial transport medium  Result Value Ref Range Status   SARS Coronavirus 2 by RT PCR NEGATIVE NEGATIVE Final    Comment: (NOTE) SARS-CoV-2 target nucleic acids are NOT DETECTED.  The SARS-CoV-2 RNA is generally detectable in upper respiratory specimens during the acute phase of infection. The lowest concentration of SARS-CoV-2 viral copies this assay can detect is 138 copies/mL. A negative result does not preclude SARS-Cov-2 infection and should not be used as the sole basis for treatment or other patient management decisions. A negative result may occur with  improper specimen collection/handling, submission of specimen other than nasopharyngeal swab, presence of viral mutation(s) within the areas targeted by this assay, and inadequate number of viral copies(<138 copies/mL). A negative result must be combined with clinical observations, patient history, and epidemiological information. The expected result is Negative.  Fact Sheet for Patients:  EntrepreneurPulse.com.au  Fact Sheet for Healthcare Providers:  IncredibleEmployment.be  This test is no t yet approved or cleared by the [place] FDA and  has been authorized for detection and/or diagnosis of SARS-CoV-2 by FDA under an Emergency Use Authorization (EUA). This EUA will remain  in effect (meaning this test can be used) for the duration of the COVID-19 declaration under Section 564(b)(1) of the Act, 21 U.S.C.section 360bbb-3(b)(1), unless the authorization is terminated  or revoked sooner.       Influenza A by PCR NEGATIVE NEGATIVE Final   Influenza B by PCR NEGATIVE NEGATIVE Final    Comment:  (NOTE) The Xpert Xpress SARS-CoV-2/FLU/RSV plus assay is intended as an aid in the diagnosis of influenza from Nasopharyngeal swab specimens and should not be used as a sole basis for treatment.  Nasal washings and aspirates are unacceptable for Xpert Xpress SARS-CoV-2/FLU/RSV testing.  Fact Sheet for Patients: BloggerCourse.com  Fact Sheet for Healthcare Providers: SeriousBroker.it  This test is not yet approved or cleared by the [place] FDA and has been authorized for detection and/or diagnosis of SARS-CoV-2 by FDA under an Emergency Use Authorization (EUA). This EUA will remain in effect (meaning this test can be used) for the duration of the COVID-19 declaration under Section 564(b)(1) of the Act, 21 U.S.C. section 360bbb-3(b)(1), unless the authorization is terminated or revoked.  Performed at [facility], [address], [place], [place] [address]      Time coordinating discharge: 35 minutes  SIGNED:   Jacquelin Hawking, MD [facility] 05/06/2021, 11:25 AM

## 2021-05-08 LAB — CULTURE, BLOOD (ROUTINE X 2)
Culture: NO GROWTH
Special Requests: ADEQUATE

## 2021-05-09 LAB — CULTURE, BLOOD (ROUTINE X 2)
Culture: NO GROWTH
Culture: NO GROWTH

## 2021-12-09 ENCOUNTER — Emergency Department (HOSPITAL_BASED_OUTPATIENT_CLINIC_OR_DEPARTMENT_OTHER): Payer: Medicare Other

## 2021-12-09 ENCOUNTER — Emergency Department (HOSPITAL_BASED_OUTPATIENT_CLINIC_OR_DEPARTMENT_OTHER)
Admission: EM | Admit: 2021-12-09 | Discharge: 2021-12-09 | Disposition: A | Discharge status: Home / Self Care | Payer: Medicare Other | Attending: Emergency Medicine | Admitting: Emergency Medicine

## 2021-12-09 ENCOUNTER — Encounter (HOSPITAL_BASED_OUTPATIENT_CLINIC_OR_DEPARTMENT_OTHER): Payer: Self-pay | Admitting: Urology

## 2021-12-09 ENCOUNTER — Other Ambulatory Visit: Payer: Self-pay

## 2021-12-09 DIAGNOSIS — R0789 Other chest pain: Secondary | ICD-10-CM | POA: Insufficient documentation

## 2021-12-09 DIAGNOSIS — I10 Essential (primary) hypertension: Secondary | ICD-10-CM | POA: Diagnosis not present

## 2021-12-09 DIAGNOSIS — Z79899 Other long term (current) drug therapy: Secondary | ICD-10-CM | POA: Diagnosis not present

## 2021-12-09 DIAGNOSIS — M25551 Pain in right hip: Secondary | ICD-10-CM | POA: Insufficient documentation

## 2021-12-09 LAB — CBC
HCT: 39.5 % (ref 36.0–46.0)
Hemoglobin: 13.3 g/dL (ref 12.0–15.0)
MCH: 30.6 pg (ref 26.0–34.0)
MCHC: 33.7 g/dL (ref 30.0–36.0)
MCV: 91 fL (ref 80.0–100.0)
Platelets: 322 10*3/uL (ref 150–400)
RBC: 4.34 MIL/uL (ref 3.87–5.11)
RDW: 11.2 % — ABNORMAL LOW (ref 11.5–15.5)
WBC: 13.8 10*3/uL — ABNORMAL HIGH (ref 4.0–10.5)
nRBC: 0 % (ref 0.0–0.2)

## 2021-12-09 LAB — BASIC METABOLIC PANEL
Anion gap: 8 (ref 5–15)
BUN: 18 mg/dL (ref 8–23)
CO2: 27 mmol/L (ref 22–32)
Calcium: 9.7 mg/dL (ref 8.9–10.3)
Chloride: 105 mmol/L (ref 98–111)
Creatinine, Ser: 1.08 mg/dL — ABNORMAL HIGH (ref 0.44–1.00)
GFR, Estimated: 54 mL/min — ABNORMAL LOW (ref >60)
Glucose, Bld: 99 mg/dL (ref 70–99)
Potassium: 3.7 mmol/L (ref 3.5–5.1)
Sodium: 140 mmol/L (ref 135–145)

## 2021-12-09 LAB — TROPONIN I (HIGH SENSITIVITY)
Troponin I (High Sensitivity): 3 ng/L (ref <18)
Troponin I (High Sensitivity): 4 ng/L (ref <18)

## 2021-12-09 MED ORDER — ACETAMINOPHEN 325 MG PO TABS: 650.0000 mg | ORAL_TABLET | Freq: Four times a day (QID) | ORAL | 0 refills | Status: AC | PRN

## 2021-12-09 MED ORDER — NAPROXEN 250 MG PO TABS: 250.0000 mg | ORAL_TABLET | Freq: Two times a day (BID) | ORAL | 0 refills | Status: AC | PRN

## 2021-12-09 MED ORDER — ACETAMINOPHEN 325 MG PO TABS
650.0000 mg | ORAL_TABLET | Freq: Once | ORAL | Status: AC
  Administered 2021-12-09: 650 mg via ORAL
  Filled 2021-12-09: qty 2

## 2021-12-09 MED ORDER — LIDOCAINE 5 % EX PTCH
1.0000 | MEDICATED_PATCH | CUTANEOUS | Status: DC
  Administered 2021-12-09: 1 via TRANSDERMAL
  Filled 2021-12-09: qty 1

## 2021-12-09 MED ORDER — IBUPROFEN 400 MG PO TABS
600.0000 mg | ORAL_TABLET | Freq: Once | ORAL | Status: AC
  Administered 2021-12-09: 600 mg via ORAL
  Filled 2021-12-09: qty 1

## 2021-12-09 NOTE — ED Triage Notes (Signed)
Pt states right sided hip pain radiating down to foot and up to shoulder on right side that started 2 days ago  States lifted a heavy bag when it started Edema noted to lower extremities +1 pitting

## 2021-12-09 NOTE — ED Provider Notes (Signed)
[facility] EMERGENCY DEPARTMENT Provider Note   CSN: [id number] Arrival date & time: 12/09/21  2003     History  Chief Complaint  Patient presents with   Hip Pain    Tiffany Schultz is a 76 y.o. female.  Patient as above with significant medical history as below, including GERD, hypertension who presents to the ED with complaint of right hip pain, right-sided chest wall and neck pain.  Patient reports over the past week she has been carrying "some heavy babies" which she feels like may have provoked her discomfort.  Over the last 2 to 3 days her discomfort is worsened.  Worsened with exertion or with ambulation.  Worsened with movement from a seated to a standing position.  No medications for symptomatic control at home.  Pain initially began to her right hip she now having discomfort in her right shoulder, right neck, right side chest.  Not associate with dyspnea, nausea, diaphoresis, lightheadedness, numbness or tingling.  No falls or known injuries.  No LOC.  No recent medication changes.  No fevers, chills, IV drug use.    Past Medical History:  Diagnosis Date   GERD (gastroesophageal reflux disease)    Hypertension     Past Surgical History:  Procedure Laterality Date   ABDOMINAL HYSTERECTOMY     APPENDECTOMY     NEPHRECTOMY       The history is provided by the patient. No language interpreter was used.  Hip Pain Associated symptoms include chest pain. Pertinent negatives include no abdominal pain, no headaches and no shortness of breath.       Home Medications Prior to Admission medications   Medication Sig Start Date End Date Taking? Authorizing Provider  acetaminophen (TYLENOL) 325 MG tablet Take 2 tablets (650 mg total) by mouth every 6 (six) hours as needed. 12/09/21  Yes Wynona Dove A, DO  naproxen (NAPROSYN) 250 MG tablet Take 1 tablet (250 mg total) by mouth 2 (two) times daily as needed. 12/09/21  Yes Wynona Dove A, DO  amLODipine (NORVASC) 10 MG  tablet Take 10 mg by mouth daily. 03/02/21   [provider]  cholecalciferol (VITAMIN D3) 25 MCG (1000 UNIT) tablet Take 3,000 Units by mouth daily.    [provider]  diclofenac Sodium (VOLTAREN) 1 % GEL 2 g daily as needed (pain). 11/27/18   [provider]  Flaxseed, Linseed, (FLAXSEED OIL PO) Take 1 tablet by mouth daily.    [provider]  furosemide (LASIX) 20 MG tablet Take 20 mg by mouth daily. 03/02/21   [provider]  ketorolac (ACULAR) 0.5 % ophthalmic solution Place 1 drop into the right eye 4 (four) times daily.    [provider]  latanoprost (XALATAN) 0.005 % ophthalmic solution Place 1 drop into the left eye at bedtime. 03/22/21   [provider]  moxifloxacin (VIGAMOX) 0.5 % ophthalmic solution Place 1 drop into the right eye in the morning, at noon, in the evening, and at bedtime.    [provider]  omeprazole (PRILOSEC) 40 MG capsule Take 40 mg by mouth daily. 03/22/21   [provider]  ondansetron (ZOFRAN ODT) 4 MG disintegrating tablet Take 1 tablet (4 mg total) by mouth every 8 (eight) hours as needed for nausea or vomiting. 05/03/21   Kommor, Madison, MD  prednisoLONE acetate (PRED FORTE) 1 % ophthalmic suspension Place 1 drop into the right eye 4 (four) times daily.    [provider]  rosuvastatin (CRESTOR) 5  MG tablet Take 5 mg by mouth daily. 03/06/21   [provider]      Allergies    Morphine and related, Penicillins, and Tramadol    Review of Systems   Review of Systems  Constitutional:  Negative for chills and fever.  HENT:  Negative for facial swelling and trouble swallowing.   Eyes:  Negative for photophobia and visual disturbance.  Respiratory:  Negative for cough and shortness of breath.   Cardiovascular:  Positive for chest pain. Negative for palpitations.  Gastrointestinal:  Negative for abdominal pain, nausea and vomiting.  Endocrine: Negative for  polydipsia and polyuria.  Genitourinary:  Negative for difficulty urinating and hematuria.  Musculoskeletal:  Positive for arthralgias and neck pain. Negative for gait problem and joint swelling.  Skin:  Negative for pallor and rash.  Neurological:  Negative for syncope and headaches.  Psychiatric/Behavioral:  Negative for agitation and confusion.     Physical Exam Updated Vital Signs BP (!) 135/92   Pulse 82   Temp 98 F (36.7 C) (Oral)   Resp 19   Ht 5\' 2"  (1.575 m)   Wt 64.4 kg   SpO2 92%   BMI 25.97 kg/m  Physical Exam Vitals and nursing note reviewed.  Constitutional:      General: She is not in acute distress.    Appearance: Normal appearance.  HENT:     Head: Normocephalic and atraumatic.     Right Ear: External ear normal.     Left Ear: External ear normal.     Nose: Nose normal.     Mouth/Throat:     Mouth: Mucous membranes are moist.  Eyes:     General: No scleral icterus.       Right eye: No discharge.        Left eye: No discharge.  Cardiovascular:     Rate and Rhythm: Normal rate and regular rhythm.     Pulses: Normal pulses.     Heart sounds: Normal heart sounds.  Pulmonary:     Effort: Pulmonary effort is normal. No respiratory distress.     Breath sounds: Normal breath sounds.  Abdominal:     General: Abdomen is flat.     Tenderness: There is no abdominal tenderness.  Musculoskeletal:        General: Normal range of motion.     Cervical back: Full passive range of motion without pain and normal range of motion.     Right lower leg: 1+ Edema present.     Left lower leg: 1+ Edema present.     Comments: No TTP to acetabulums bilateral.  Full range of motion to bilateral lower extremities.  Lower extremities neurovascular intact.  Negative straight leg raise bilateral.  Skin:    General: Skin is warm and dry.     Capillary Refill: Capillary refill takes less than 2 seconds.  Neurological:     Mental Status: She is alert and oriented to person,  place, and time.     GCS: GCS eye subscore is 4. GCS verbal subscore is 5. GCS motor subscore is 6.  Psychiatric:        Mood and Affect: Mood normal.        Behavior: Behavior normal.     ED Results / Procedures / Treatments   Labs (all labs ordered are listed, but only abnormal results are displayed) Labs Reviewed  BASIC METABOLIC PANEL - Abnormal; Notable for the following components:      Result Value  Creatinine, Ser 1.08 (*)    GFR, Estimated 54 (*)    All other components within normal limits  CBC - Abnormal; Notable for the following components:   WBC 13.8 (*)    RDW 11.2 (*)    All other components within normal limits  TROPONIN I (HIGH SENSITIVITY)  TROPONIN I (HIGH SENSITIVITY)    EKG EKG Interpretation  Date/Time:  Thursday December 09 2021 20:42:35 EDT Ventricular Rate:  93 PR Interval:  164 QRS Duration: 86 QT Interval:  375 QTC Calculation: 467 R Axis:   34 Text Interpretation: Sinus rhythm Confirmed by Ripley Fraise [phone]) on 12/10/2021 7:53:07 AM  Radiology DG Chest Port 1 View  Result Date: 12/09/2021 CLINICAL DATA:  Right chest pain EXAM: PORTABLE CHEST 1 VIEW COMPARISON:  05/03/2021 FINDINGS: The heart size and mediastinal contours are within normal limits. Both lungs are clear. The visualized skeletal structures are unremarkable. IMPRESSION: No active disease. Electronically Signed   By: Fidela Salisbury M.D.   On: 12/09/2021 21:13   DG Hip Unilat W or Wo Pelvis 2-3 Views Right  Result Date: 12/09/2021 CLINICAL DATA:  Right hip pain EXAM: DG HIP (WITH OR WITHOUT PELVIS) 2-3V RIGHT COMPARISON:  None Available. FINDINGS: Normal alignment. No acute fracture or dislocation. Mild right hip degenerative arthritis. Soft tissues are unremarkable. IMPRESSION: Mild right hip degenerative arthritis. Electronically Signed   By: Fidela Salisbury M.D.   On: 12/09/2021 21:08    Procedures Procedures    Medications Ordered in ED Medications  ibuprofen (ADVIL)  tablet 600 mg (600 mg Oral Given 12/09/21 2109)  acetaminophen (TYLENOL) tablet 650 mg (650 mg Oral Given 12/09/21 2109)    ED Course/ Medical Decision Making/ A&P Clinical Course as of 12/10/21 1626  Thu Dec 09, 2021  2219 WBC(!): 13.8 She has chronic leukocytosis, she is afebrile.  Sepsis was considered but suspicion is low [SG]    Clinical Course User Index [SG] Jeanell Sparrow, DO                           Medical Decision Making Amount and/or Complexity of Data Reviewed Labs: ordered. Decision-making details documented in ED Course. Radiology: ordered.  Risk OTC drugs. Prescription drug management.    CC: Hip pain, chest, neck pain  This patient presents to the Emergency Department for the above complaint. This involves an extensive number of treatment options and is a complaint that carries with it a high risk of complications and morbidity. Vital signs were reviewed. Serious etiologies considered.  Differential includes all life-threatening causes for chest pain. This includes but is not exclusive to acute coronary syndrome, aortic dissection, pulmonary embolism, cardiac tamponade, community-acquired pneumonia, pericarditis, musculoskeletal chest wall pain, sprain, strain, MSK etc.  Record review:  Previous records obtained and reviewed prior office visits, prior labs and imaging, prior ED visits  Additional history obtained from [facility] and surgical history as noted above.   Work up as above, notable for:  Labs & imaging results that were available during my care of the patient were visualized by me and considered in my medical decision making.   I ordered imaging studies which included right hip x-ray, chest x-ray. I visualized the imaging, interpreted images, and I agree with radiologist interpretation. No acute process  Cardiac monitoring reviewed and interpreted personally which shows NSR  Management: Analgesics   ED Course: Clinical Course as of  12/10/21 1626  Thu Dec 09, 2021  2219  WBC(!): 13.8 She has chronic leukocytosis, she is afebrile.  Sepsis was considered but suspicion is low [SG]    Clinical Course User Index [SG] Tanda Rockers A, DO     Reassessment:  Symptoms resolved, favor msk as etiology of her complaints today. Discussed supportive care at home, close o/p pcp f/u. She is ambulatory with a steady gait, feels her pain has resolved entirely.  She is neurologically intact.  She is back to her baseline.  No chest pain.  Admission was considered.   The patient's chest pain is not suggestive of pulmonary embolus, cardiac ischemia, aortic dissection, pericarditis, myocarditis, pulmonary embolism, pneumothorax, pneumonia, Zoster, or esophageal perforation, or other serious etiology.  Historically not abrupt in onset, tearing or ripping, pulses symmetric. EKG nonspecific for ischemia/infarction. No dysrhythmias, brugada, WPW, prolonged QT noted.   Troponin negative x2. CXR reviewed. Labs without demonstration of acute pathology unless otherwise noted above. Low HEART Score: 0-3 points (0.9-1.7% risk of MACE).  Given the extremely low risk of these diagnoses further testing and evaluation for these possibilities does not appear to be indicated at this time. Patient in no distress and overall condition improved here in the ED. Detailed discussions were had with the patient regarding current findings, and need for close f/u with PCP or on call doctor. The patient has been instructed to return immediately if the symptoms worsen in any way for re-evaluation. Patient verbalized understanding and is in agreement with current care plan. All questions answered prior to discharge.              Social determinants of health include -  Social History   Socioeconomic History   Marital status: Married    Spouse name: Not on file   Number of children: Not on file   Years of education: Not on file   Highest education level: Not  on file  Occupational History   Not on file  Tobacco Use   Smoking status: Never   Smokeless tobacco: Not on file  Substance and Sexual Activity   Alcohol use: No    Alcohol/week: 0.0 standard drinks of alcohol   Drug use: No   Sexual activity: Not on file  Other Topics Concern   Not on file  Social History Narrative   Not on file   Social Determinants of Health   Financial Resource Strain: Not on file  Food Insecurity: Not on file  Transportation Needs: Not on file  Physical Activity: Not on file  Stress: Not on file  Social Connections: Not on file  Intimate Partner Violence: Not on file      This chart was dictated using voice recognition software.  Despite best efforts to proofread,  errors can occur which can change the documentation meaning.         Final Clinical Impression(s) / ED Diagnoses Final diagnoses:  Right hip pain  Atypical chest pain    Rx / DC Orders ED Discharge Orders          Ordered    naproxen (NAPROSYN) 250 MG tablet  2 times daily PRN        12/09/21 2317    acetaminophen (TYLENOL) 325 MG tablet  Every 6 hours PRN        12/09/21 2317              Tanda Rockers A, DO 12/10/21 1626

## 2021-12-09 NOTE — Discharge Instructions (Addendum)

## 2022-04-26 ENCOUNTER — Encounter (HOSPITAL_BASED_OUTPATIENT_CLINIC_OR_DEPARTMENT_OTHER): Payer: Self-pay | Admitting: Emergency Medicine

## 2022-04-26 ENCOUNTER — Emergency Department (HOSPITAL_BASED_OUTPATIENT_CLINIC_OR_DEPARTMENT_OTHER): Payer: Medicare Other

## 2022-04-26 ENCOUNTER — Emergency Department (HOSPITAL_BASED_OUTPATIENT_CLINIC_OR_DEPARTMENT_OTHER)
Admission: EM | Admit: 2022-04-26 | Discharge: 2022-04-26 | Disposition: A | Discharge status: Home / Self Care | Payer: Medicare Other | Attending: Emergency Medicine | Admitting: Emergency Medicine

## 2022-04-26 ENCOUNTER — Telehealth (HOSPITAL_BASED_OUTPATIENT_CLINIC_OR_DEPARTMENT_OTHER): Payer: Self-pay | Admitting: Emergency Medicine

## 2022-04-26 ENCOUNTER — Other Ambulatory Visit: Payer: Self-pay

## 2022-04-26 DIAGNOSIS — I10 Essential (primary) hypertension: Secondary | ICD-10-CM | POA: Diagnosis not present

## 2022-04-26 DIAGNOSIS — M25511 Pain in right shoulder: Secondary | ICD-10-CM | POA: Diagnosis not present

## 2022-04-26 MED ORDER — KETOROLAC TROMETHAMINE 15 MG/ML IJ SOLN: 15.0000 mg | Freq: Once | INTRAMUSCULAR | Status: DC

## 2022-04-26 MED ORDER — ACETAMINOPHEN 500 MG PO TABS
1000.0000 mg | ORAL_TABLET | Freq: Once | ORAL | Status: AC
  Administered 2022-04-26: 1000 mg via ORAL
  Filled 2022-04-26: qty 2

## 2022-04-26 NOTE — ED Notes (Signed)
Patient transported to X-ray 

## 2022-04-26 NOTE — ED Provider Notes (Signed)
[facility] EMERGENCY DEPARTMENT Provider Note   CSN: [id number] Arrival date & time: 04/26/22  0737     History Chief Complaint  Patient presents with   Shoulder Pain    HPI Tiffany Schultz is a 76 y.o. female presenting for right-sided neck and shoulder pain.  She i has a history of hyperlipidemia, hypertension, GERD.  Pain started last night.  Pain is been progressive in nature over the night.  She does endorse a longstanding history of arthritis and states that this feels similar.  She is still working and has to lift items for work.  She presents from work because of her ongoing pain.   Patient's recorded medical, surgical, social, medication list and allergies were reviewed in the Snapshot window as part of the initial history.   Review of Systems   Review of Systems  Constitutional:  Negative for chills and fever.  HENT:  Negative for ear pain and sore throat.   Eyes:  Negative for pain and visual disturbance.  Respiratory:  Negative for cough and shortness of breath.   Cardiovascular:  Negative for chest pain and palpitations.  Gastrointestinal:  Negative for abdominal pain and vomiting.  Genitourinary:  Negative for dysuria and hematuria.  Musculoskeletal:  Positive for arthralgias and myalgias. Negative for back pain.  Skin:  Negative for color change and rash.  Neurological:  Negative for seizures and syncope.  All other systems reviewed and are negative.   Physical Exam Updated Vital Signs BP (!) 143/85   Pulse (!) 102   Temp 98.7 F (37.1 C) (Oral)   Resp 16   Ht 5\' 2"  (1.575 m)   Wt 65.3 kg   SpO2 99%   BMI 26.34 kg/m  Physical Exam Vitals and nursing note reviewed.  Constitutional:      General: She is not in acute distress.    Appearance: She is well-developed.  HENT:     Head: Normocephalic and atraumatic.  Eyes:     Conjunctiva/sclera: Conjunctivae normal.  Cardiovascular:     Rate and Rhythm: Normal rate and regular rhythm.     Heart  sounds: No murmur heard. Pulmonary:     Effort: Pulmonary effort is normal. No respiratory distress.     Breath sounds: Normal breath sounds.  Abdominal:     General: There is no distension.     Palpations: Abdomen is soft.     Tenderness: There is no abdominal tenderness. There is no right CVA tenderness or left CVA tenderness.  Musculoskeletal:        General: Tenderness present. No swelling.     Cervical back: Neck supple.     Comments: Patient has tenderness to motion of the right shoulder joint.  She tolerates active range of motion slowly.  Skin:    General: Skin is warm and dry.  Neurological:     General: No focal deficit present.     Mental Status: She is alert and oriented to person, place, and time. Mental status is at baseline.     Cranial Nerves: No cranial nerve deficit.      ED Course/ Medical Decision Making/ A&P    Procedures Procedures   Medications Ordered in ED Medications  acetaminophen (TYLENOL) tablet 1,000 mg (1,000 mg Oral Given 04/26/22 04/28/22)    Medical Decision Making:    Ryelynn Guedea is a 76 y.o. female who presented to the ED today with diffuse arthralgias, today localized on her right shoulder detailed above.     Patient's  presentation is complicated by their history of advanced age, multiple comorbid medical conditions, requirement of outpatient medications.  Patient placed on continuous vitals and telemetry monitoring while in ED which was reviewed periodically.   Complete initial physical exam performed, notably the patient  was hemodynamically stable in no acute distress.      Reviewed and confirmed nursing documentation for past medical history, family history, social history.    Initial Assessment:   With the patient's presentation of right shoulder pain, most likely diagnosis is acute on chronic arthritis versus subluxation or dislocation. Other diagnoses were considered including (but not limited to) septic arthritis, gouty arthritis.  These are considered less likely due to history of present illness and physical exam findings.   This is most consistent with an acute complicated illness  Initial Plan:  X-ray right shoulder to evaluate any structural etiology or abnormality. Objective evaluation as below reviewed with plan for close reassessment  Initial Study Results:   Radiology  All images reviewed independently. Agree with radiology report at this time.   DG Shoulder Right  Result Date: 04/26/2022 CLINICAL DATA:  Right shoulder pain.  Worse at night. EXAM: RIGHT SHOULDER - 2+ VIEW COMPARISON:  Right shoulder radiographs 01/31/2019 FINDINGS: Mild acromioclavicular joint space narrowing and peripheral osteophytosis, similar to prior. The glenohumeral joint space is maintained. Mild-to-moderate distal lateral subacromial spurring. The superior humeral head subcortical lucencies with thin sclerotic borders, chronic. This may be secondary to subacromial impingement. No acute fracture or dislocation. IMPRESSION: 1. Mild acromioclavicular osteoarthritis, similar to prior. 2. Mild-to-moderate distal lateral subacromial spurring. Electronically Signed   By: Neita Garnet M.D.   On: 04/26/2022 08:29     Final Assessment and Plan:   X-ray with no focal pathology.  Given patient's well appearance, I do not believe any further intervention in the emergency department is indicated.  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  Patient will start on Tylenol 650 mg every 6 hours and will trial short-term steroid burst with prednisone 5 mg daily x5 days with plan to follow-up with PCP for reassessment in the outpatient setting.   Disposition:  I have considered need for hospitalization, however, considering all of the above, I believe this patient is stable for discharge at this time.  Patient/family educated about specific return precautions for  given chief complaint and symptoms.  Patient/family educated about follow-up with PCP.     Patient/family expressed understanding of return precautions and need for follow-up. Patient spoken to regarding all imaging and laboratory results and appropriate follow up for these results. All education provided in verbal form with additional information in written form. Time was allowed for answering of patient questions. Patient discharged.    Emergency Department Medication Summary:   Medications  acetaminophen (TYLENOL) tablet 1,000 mg (1,000 mg Oral Given 04/26/22 6834)         Clinical Impression:  1. Acute pain of right shoulder      Discharge   Final Clinical Impression(s) / ED Diagnoses Final diagnoses:  Acute pain of right shoulder    Rx / DC Orders ED Discharge Orders     None         Glyn Ade, MD 04/26/22 [phone]

## 2022-04-26 NOTE — ED Triage Notes (Signed)
Pt arrives pov with slow gait, c/o RT side Neck and shoulder pain starting last night. Pt denies CP

## 2022-04-26 NOTE — ED Notes (Signed)
Pt discharged to home. Discharge instructions have been discussed with patient and/or family members. Pt verbally acknowledges understanding d/c instructions, and endorses comprehension to checkout at registration before leaving.  °

## 2022-07-25 ENCOUNTER — Emergency Department (HOSPITAL_BASED_OUTPATIENT_CLINIC_OR_DEPARTMENT_OTHER)
Admission: EM | Admit: 2022-07-25 | Discharge: 2022-07-25 | Disposition: A | Discharge status: Home / Self Care | Payer: Medicare (Managed Care) | Attending: Emergency Medicine | Admitting: Emergency Medicine

## 2022-07-25 ENCOUNTER — Other Ambulatory Visit: Payer: Self-pay

## 2022-07-25 ENCOUNTER — Encounter (HOSPITAL_BASED_OUTPATIENT_CLINIC_OR_DEPARTMENT_OTHER): Payer: Self-pay

## 2022-07-25 DIAGNOSIS — R300 Dysuria: Secondary | ICD-10-CM | POA: Diagnosis present

## 2022-07-25 DIAGNOSIS — Z79899 Other long term (current) drug therapy: Secondary | ICD-10-CM | POA: Insufficient documentation

## 2022-07-25 DIAGNOSIS — I10 Essential (primary) hypertension: Secondary | ICD-10-CM | POA: Insufficient documentation

## 2022-07-25 DIAGNOSIS — N3000 Acute cystitis without hematuria: Secondary | ICD-10-CM | POA: Diagnosis not present

## 2022-07-25 LAB — CBC WITH DIFFERENTIAL/PLATELET
Abs Immature Granulocytes: 0.02 10*3/uL (ref 0.00–0.07)
Basophils Absolute: 0.1 10*3/uL (ref 0.0–0.1)
Basophils Relative: 1 %
Eosinophils Absolute: 0.1 10*3/uL (ref 0.0–0.5)
Eosinophils Relative: 1 %
HCT: 40.8 % (ref 36.0–46.0)
Hemoglobin: 13.5 g/dL (ref 12.0–15.0)
Immature Granulocytes: 0 %
Lymphocytes Relative: 25 %
Lymphs Abs: 2.5 10*3/uL (ref 0.7–4.0)
MCH: 29.9 pg (ref 26.0–34.0)
MCHC: 33.1 g/dL (ref 30.0–36.0)
MCV: 90.5 fL (ref 80.0–100.0)
Monocytes Absolute: 0.9 10*3/uL (ref 0.1–1.0)
Monocytes Relative: 9 %
Neutro Abs: 6.4 10*3/uL (ref 1.7–7.7)
Neutrophils Relative %: 64 %
Platelets: 319 10*3/uL (ref 150–400)
RBC: 4.51 MIL/uL (ref 3.87–5.11)
RDW: 11.3 % — ABNORMAL LOW (ref 11.5–15.5)
WBC: 10.1 10*3/uL (ref 4.0–10.5)
nRBC: 0 % (ref 0.0–0.2)

## 2022-07-25 LAB — BASIC METABOLIC PANEL
Anion gap: 8 (ref 5–15)
BUN: 15 mg/dL (ref 8–23)
CO2: 26 mmol/L (ref 22–32)
Calcium: 9.2 mg/dL (ref 8.9–10.3)
Chloride: 105 mmol/L (ref 98–111)
Creatinine, Ser: 0.97 mg/dL (ref 0.44–1.00)
GFR, Estimated: >60 mL/min (ref >60)
Glucose, Bld: 85 mg/dL (ref 70–99)
Potassium: 3.8 mmol/L (ref 3.5–5.1)
Sodium: 139 mmol/L (ref 135–145)

## 2022-07-25 LAB — URINALYSIS, MICROSCOPIC (REFLEX): WBC, UA: >50 WBC/hpf (ref 0–5)

## 2022-07-25 LAB — URINALYSIS, ROUTINE W REFLEX MICROSCOPIC
Bilirubin Urine: NEGATIVE
Glucose, UA: NEGATIVE mg/dL
Ketones, ur: NEGATIVE mg/dL
Nitrite: NEGATIVE
Protein, ur: 100 mg/dL — AB
Specific Gravity, Urine: >=1.03 (ref 1.005–1.030)
pH: 5.5 (ref 5.0–8.0)

## 2022-07-25 MED ORDER — CEFDINIR 300 MG PO CAPS: 300.0000 mg | ORAL_CAPSULE | Freq: Two times a day (BID) | ORAL | 0 refills | Status: AC

## 2022-07-25 MED ORDER — SODIUM CHLORIDE 0.9 % IV SOLN
1.0000 g | Freq: Once | INTRAVENOUS | Status: AC
  Administered 2022-07-25: 1 g via INTRAVENOUS
  Filled 2022-07-25: qty 10

## 2022-07-25 NOTE — Discharge Instructions (Addendum)
We evaluated you for your urinary symptoms.  Your urine test showed signs of urinary infection.  Your blood work was reassuring and your kidney function was normal.  We have started you on an antibiotic which you can start tomorrow.  Please take this twice a day for 7 days.  We gave you a dose of antibiotics through your IV in the emergency department.  Since you have 1 kidney, if any of your symptoms worsen, please immediately return so we can reevaluate you.  Please specifically also return if you develop any fevers or chills, severe flank pain, blood in your urine, severe abdominal pain, nausea or vomiting, or any other concerning symptoms

## 2022-07-25 NOTE — ED Provider Notes (Signed)
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  Patient reports dysuria for the past 2 to 3 days.  She reports also increased urinary frequency.  No abdominal pain.  She reports occasionally has some right flank discomfort which is mild.  No fevers or chills.  No nausea or vomiting.  No diarrhea.  No hematuria.  Feels similar to previous urinary infection but she does not feel nearly as bad as when she needed to be admitted.   Past Medical History Past Medical History:  Diagnosis Date   GERD (gastroesophageal reflux disease)    Hypertension    Patient Active Problem List   Diagnosis Date Noted   Bacteremia 05/05/2021   GERD (gastroesophageal reflux disease)    Hyperlipidemia    Leukocytosis    E coli bacteremia 05/04/2021   Acute cystitis without hematuria 05/04/2021   Essential hypertension 05/04/2021   Left ankle injury 11/17/2015   Home Medication(s) Prior to Admission medications   Medication Sig Start Date End Date Taking? Authorizing Provider  cefdinir (OMNICEF) 300 MG capsule Take 1 capsule (300 mg total) by mouth 2 (two) times daily for 7 days. 07/25/22 08/01/22 Yes Cristie Hem, MD  acetaminophen (TYLENOL) 325 MG tablet Take 2 tablets (650 mg total) by mouth every 6 (six) hours as needed. 12/09/21   Jeanell Sparrow, DO  amLODipine (NORVASC) 10 MG tablet Take 10 mg by mouth daily. 03/02/21   [provider]  cholecalciferol (VITAMIN D3) 25 MCG (1000 UNIT) tablet Take 3,000 Units by mouth daily.    [provider]  diclofenac Sodium (VOLTAREN) 1 % GEL 2 g daily as needed (pain). 11/27/18   [provider]  Flaxseed, Linseed, (FLAXSEED OIL PO) Take 1 tablet by mouth daily.    [provider]  furosemide (LASIX) 20 MG tablet Take 20 mg by mouth daily. 03/02/21   [provider]  ketorolac (ACULAR) 0.5 % ophthalmic solution Place 1 drop into the right eye 4 (four) times daily.    [provider]  latanoprost (XALATAN) 0.005 % ophthalmic solution Place 1 drop into the left eye at bedtime. 03/22/21   [provider]  moxifloxacin (VIGAMOX) 0.5 % ophthalmic solution Place 1 drop into the right eye in the morning, at noon, in the evening, and at bedtime.    [provider]  naproxen (NAPROSYN) 250 MG tablet Take 1 tablet (250 mg total) by mouth 2 (two) times daily as needed. 12/09/21   Jeanell Sparrow, DO  omeprazole (PRILOSEC) 40 MG capsule Take 40 mg by mouth daily. 03/22/21   [provider]  ondansetron (ZOFRAN ODT) 4 MG disintegrating tablet Take 1 tablet (4 mg total) by mouth every 8 (eight) hours as needed for nausea or vomiting. 05/03/21   Kommor, Madison, MD  prednisoLONE acetate (PRED FORTE) 1 % ophthalmic suspension Place 1 drop into the right eye 4 (four) times daily.    [provider]  rosuvastatin (CRESTOR) 5 MG tablet Take 5 mg by mouth daily. 03/06/21   [provider]  Past Surgical History Past Surgical History:  Procedure Laterality Date   ABDOMINAL HYSTERECTOMY     APPENDECTOMY     NEPHRECTOMY     Family History Family History  Family history unknown: Yes    Social History Social History   Tobacco Use   Smoking status: Never  Substance Use Topics   Alcohol use: No    Alcohol/week: 0.0 standard drinks of alcohol   Drug use: No   Allergies Morphine and related, Penicillins, and Tramadol  Review of Systems Review of Systems  All other systems reviewed and are negative.   Physical Exam Vital Signs  I have reviewed the triage vital signs BP (!)  137/90 (BP Location: Left Arm)   Pulse 92   Temp 98.3 F (36.8 C) (Oral)   Resp 16   Ht 5' 2"$  (1.575 m)   Wt 65.3 kg   SpO2 99%   BMI 26.34 kg/m  Physical Exam Vitals and nursing note reviewed.  Constitutional:      General: She is not in acute distress.    Appearance: She is well-developed.  HENT:     Head: Normocephalic and atraumatic.     Mouth/Throat:     Mouth: Mucous membranes are moist.  Eyes:     Pupils: Pupils are equal, round, and reactive to light.  Cardiovascular:     Rate and Rhythm: Normal rate and regular rhythm.     Heart sounds: No murmur heard. Pulmonary:     Effort: Pulmonary effort is normal. No respiratory distress.     Breath sounds: Normal breath sounds.  Abdominal:     General: Abdomen is flat.     Palpations: Abdomen is soft.     Tenderness: There is no abdominal tenderness. There is no right CVA tenderness or left CVA tenderness.  Musculoskeletal:        General: No tenderness.     Right lower leg: No edema.     Left lower leg: No edema.  Skin:    General: Skin is warm and dry.  Neurological:     General: No focal deficit present.     Mental Status: She is alert. Mental status is at baseline.  Psychiatric:        Mood and Affect: Mood normal.        Behavior: Behavior normal.     ED Results and Treatments Labs (all labs ordered are listed, but only abnormal results are displayed) Labs Reviewed  URINALYSIS, ROUTINE W REFLEX MICROSCOPIC - Abnormal; Notable for the following components:      Result Value   APPearance CLOUDY (*)    Hgb urine dipstick SMALL (*)    Protein, ur 100 (*)    Leukocytes,Ua LARGE (*)    All other components within normal limits  CBC WITH DIFFERENTIAL/PLATELET - Abnormal; Notable for the following components:   RDW 11.3 (*)    All other components within normal limits  URINALYSIS, MICROSCOPIC (REFLEX) - Abnormal; Notable for the following components:   Bacteria, UA FEW (*)    Non Squamous Epithelial PRESENT  (*)    All other components within normal limits  URINE CULTURE  BASIC METABOLIC PANEL  Radiology No results found.  Pertinent labs & imaging results that were available during my care of the patient were reviewed by me and considered in my medical decision making (see MDM for details).  Medications Ordered in ED Medications  cefTRIAXone (ROCEPHIN) 1 g in sodium chloride 0.9 % 100 mL IVPB (has no administration in time range)                                                                                                                                     Procedures Procedures  (including critical care time)  Medical Decision Making / ED Course   MDM:  77 year old female presenting to the emergency department with dysuria.  Suspect cystitis.  Urinalysis concerning for urinary infection.  Patient reports some right flank pain, does have right-sided kidney, but no CVA tenderness on exam, no symptoms of systemic infection.  Patient reports last time she needed to be admitted she had chills and shaking which she does not have currently.  Labs reassuring with no leukocytosis.  Doubt nephrolithiasis, patient reports her discomfort is very mild, no RBCs in urine.  Creatinine is reassuring.  Given reassuring laboratory testing, will treat with dose of ceftriaxone in the emergency department, send urine culture, started on cefdinir.  Reviewed previous culture data. Will discharge patient to home. All questions answered. Patient comfortable with plan of discharge. Return precautions discussed with patient and specified on the after visit summary.       Additional history obtained:  -External records from outside source obtained and reviewed including: Chart review including previous notes, labs, imaging, consultation notes including medical records from hospitalization  11/22   Lab Tests: -I ordered, reviewed, and interpreted labs.   The pertinent results include:   Labs Reviewed  URINALYSIS, ROUTINE W REFLEX MICROSCOPIC - Abnormal; Notable for the following components:      Result Value   APPearance CLOUDY (*)    Hgb urine dipstick SMALL (*)    Protein, ur 100 (*)    Leukocytes,Ua LARGE (*)    All other components within normal limits  CBC WITH DIFFERENTIAL/PLATELET - Abnormal; Notable for the following components:   RDW 11.3 (*)    All other components within normal limits  URINALYSIS, MICROSCOPIC (REFLEX) - Abnormal; Notable for the following components:   Bacteria, UA FEW (*)    Non Squamous Epithelial PRESENT (*)    All other components within normal limits  URINE CULTURE  BASIC METABOLIC PANEL    Notable for evidence of UTI without leukocytosis or kidney injury   Medicines ordered and prescription drug management: Meds ordered this encounter  Medications   cefTRIAXone (ROCEPHIN) 1 g in sodium chloride 0.9 % 100 mL IVPB    Order Specific Question:   Antibiotic Indication:    Answer:   UTI   cefdinir (OMNICEF) 300 MG capsule    Sig: Take 1 capsule (300 mg total) by mouth 2 (  two) times daily for 7 days.    Dispense:  14 capsule    Refill:  0    -I have reviewed the patients home medicines and have made adjustments as needed    Reevaluation: After the interventions noted above, I reevaluated the patient and found that they have improved  Co morbidities that complicate the patient evaluation  Past Medical History:  Diagnosis Date   GERD (gastroesophageal reflux disease)    Hypertension       Dispostion: Disposition decision including need for hospitalization was considered, and patient discharged from emergency department.    Final Clinical Impression(s) / ED Diagnoses Final diagnoses:  Acute cystitis without hematuria     This chart was dictated using voice recognition software.  Despite best efforts to proofread,   errors can occur which can change the documentation meaning.    Cristie Hem, MD 07/25/22 1323

## 2022-07-25 NOTE — ED Triage Notes (Signed)
C/o urinary frequency, some burning with urination. States she has lumbar back pain radiating to right leg. Hx of kidney donation, has 1 kidney as result.

## 2022-07-27 LAB — URINE CULTURE: Culture: >=100000 — AB

## 2022-07-28 ENCOUNTER — Telehealth (HOSPITAL_BASED_OUTPATIENT_CLINIC_OR_DEPARTMENT_OTHER): Payer: Self-pay | Admitting: *Deleted

## 2022-07-28 NOTE — Telephone Encounter (Signed)
Post ED Visit - Positive Culture Follow-up  Culture report reviewed by antimicrobial stewardship pharmacist: [place] Team []$  Elenor Quinones, Pharm.D. []$  Heide Guile, Pharm.D., BCPS AQ-ID []$  Parks Neptune, Pharm.D., BCPS []$  Alycia Rossetti, Pharm.D., BCPS []$  [place], Pharm.D., BCPS, AAHIVP []$  Legrand Como, Pharm.D., BCPS, AAHIVP []$  Salome Arnt, PharmD, BCPS []$  Johnnette Gourd, PharmD, BCPS []$  Hughes Better, PharmD, BCPS []$  Leeroy Cha, PharmD []$  Laqueta Linden, PharmD, BCPS []$  Albertina Parr, PharmD  [place] Team []$  Leodis Sias, PharmD []$  Lindell Spar, PharmD []$  Royetta Asal, PharmD []$  Graylin Shiver, Rph []$  Rema Fendt) Glennon Mac, PharmD []$  Arlyn Dunning, PharmD []$  Netta Cedars, PharmD []$  Dia Sitter, PharmD []$  Leone Haven, PharmD []$  Gretta Arab, PharmD []$  Theodis Shove, PharmD []$  Peggyann Juba, PharmD []$  Reuel Boom, PharmD   Positive urine culture Treated with Cefdinir, organism sensitive to the same and no further patient follow-up is required at this time. Arturo Morton, PharmD  Harlon Flor Talley 07/28/2022, 10:57 AM

## 2022-11-13 IMAGING — CT CT ABD-PELV W/ CM
2 of 5 series · 16 of 46 positions shown, 18 images · IV contrast (omnipaque)
Comparison: None.

CLINICAL DATA: Abdominal pain and fever.

EXAM:
CT ABDOMEN AND PELVIS WITH CONTRAST
TECHNIQUE: Multidetector CT imaging of the abdomen and pelvis was performed
using the standard protocol following bolus administration of
intravenous contrast.
CONTRAST:  100mL OMNIPAQUE IOHEXOL 300 MG/ML  SOLN

[Series 2: axial st · axial · 0.83mm/px · z∈[-387,-17]mm · 13 of 84 slices shown, 15 images]
[im 5/84  soft-tissue]
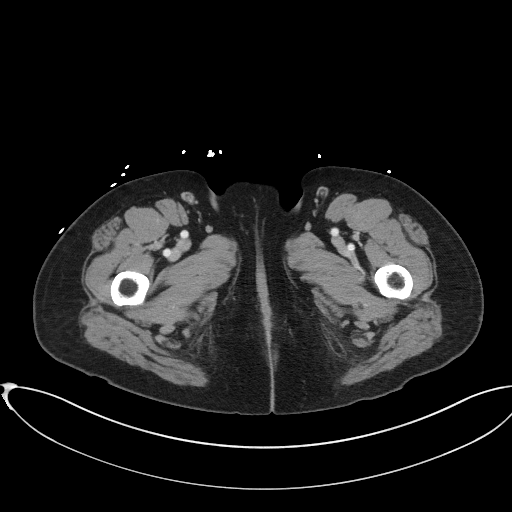
[im 5/84  bone]
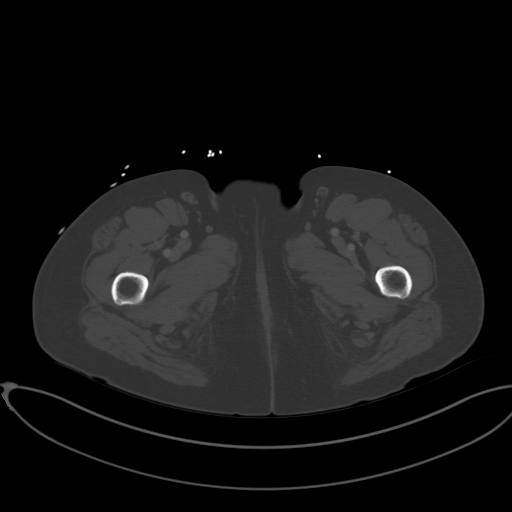
[im 13/84  soft-tissue]
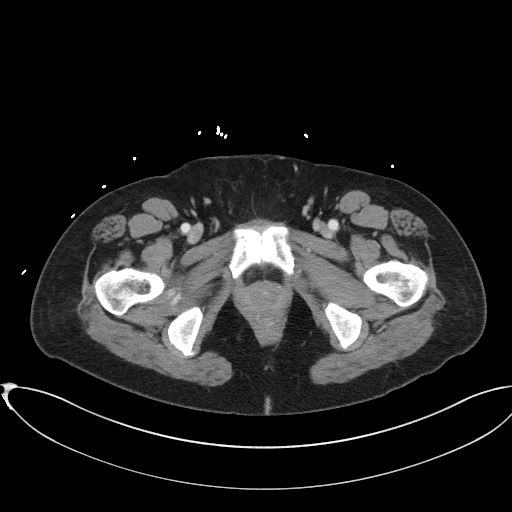
[im 17/84  soft-tissue]
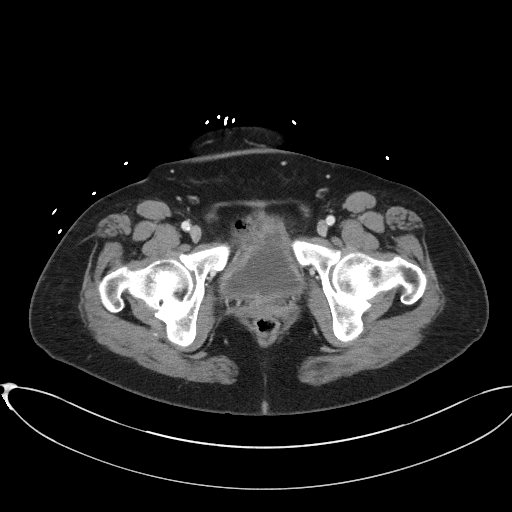
[im 25/84  soft-tissue]
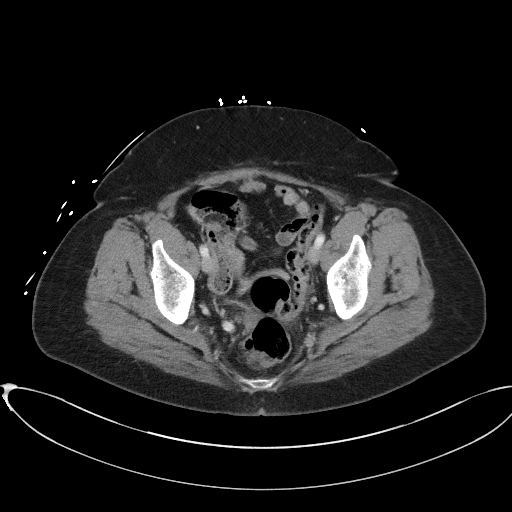
[im 30/84  soft-tissue]
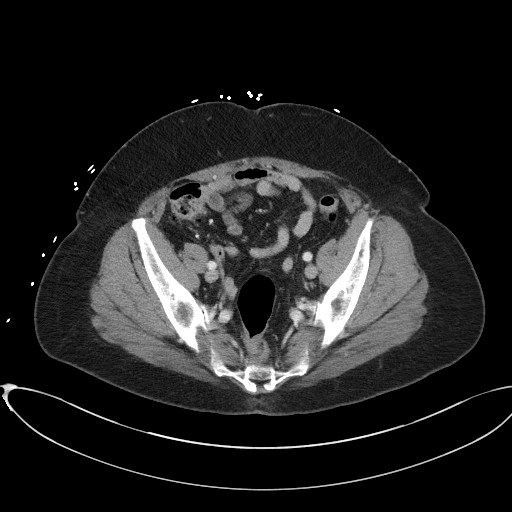
[im 38/84  soft-tissue]
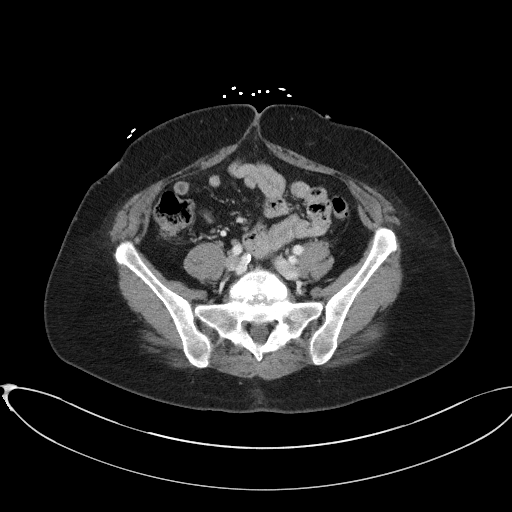
[im 42/84  soft-tissue]
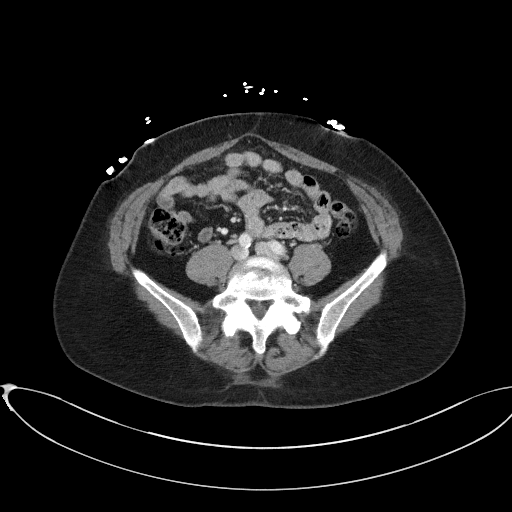
[im 46/84  soft-tissue]
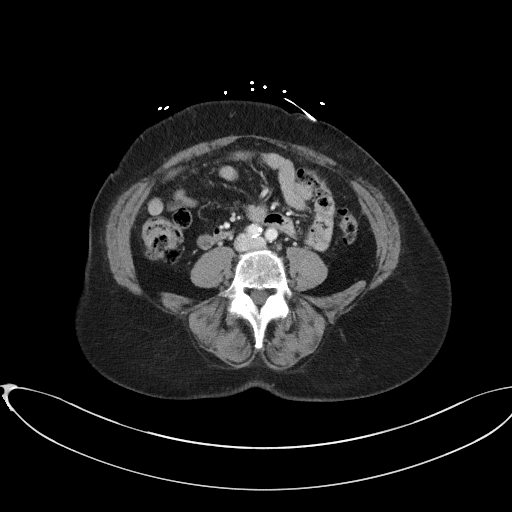
[im 54/84  soft-tissue]
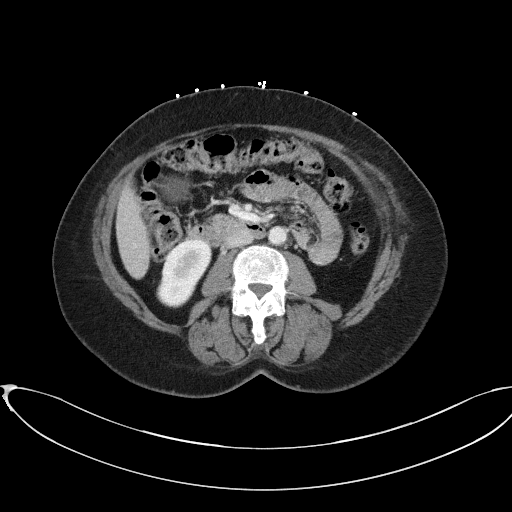
[im 54/84  bone]
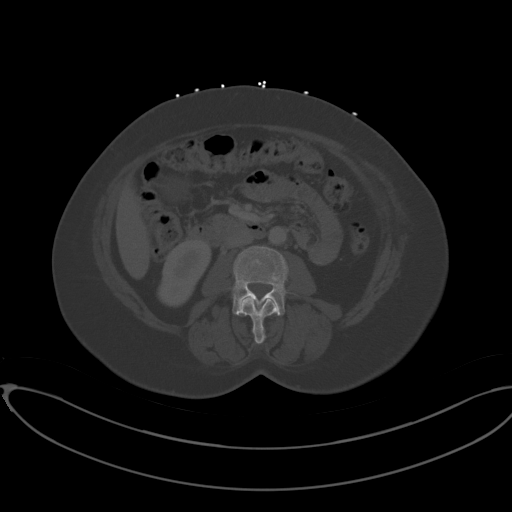
[im 59/84  soft-tissue]
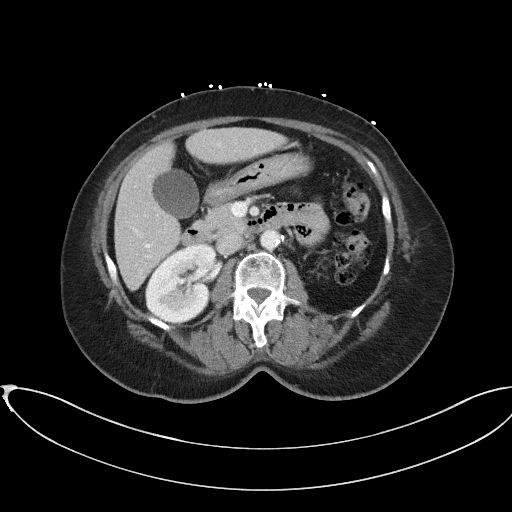
[im 67/84  soft-tissue]
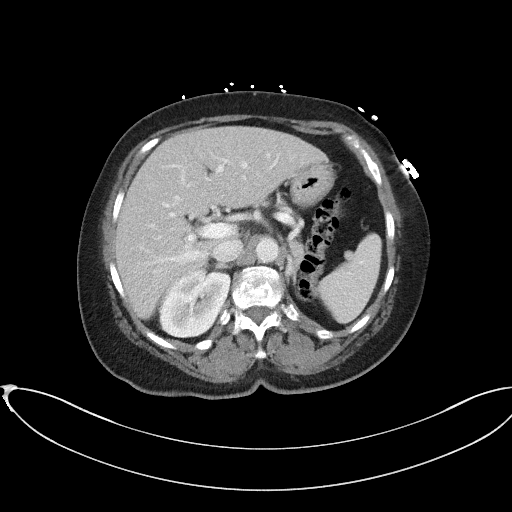
[im 71/84  soft-tissue]
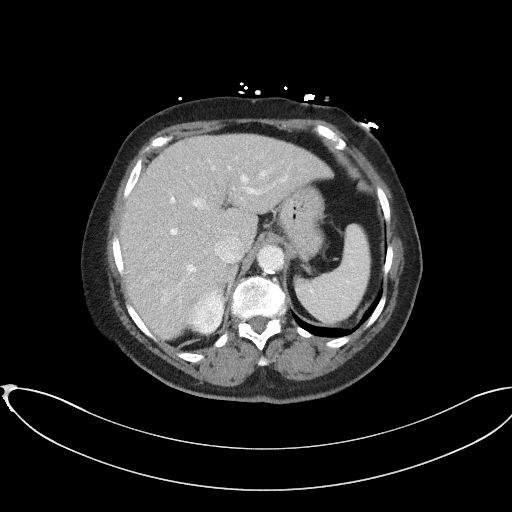
[im 79/84  soft-tissue]
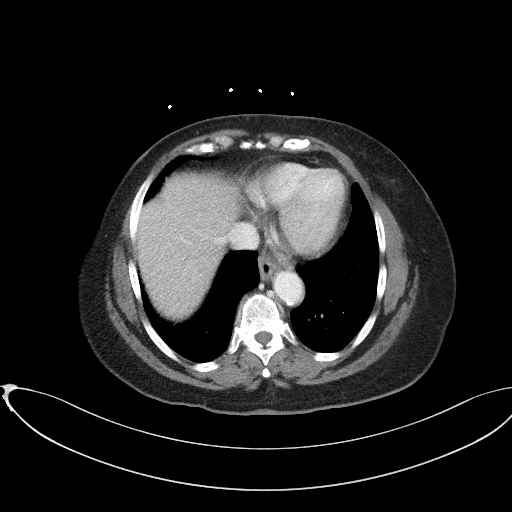

[Series 5: coronal st · coronal · 0.68mm/px · 3 of 98 slices shown]
[im 33/98  soft-tissue]
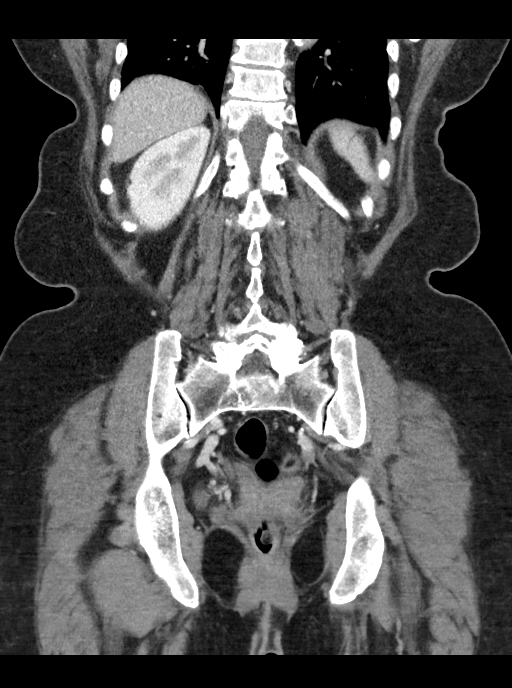
[im 44/98  soft-tissue]
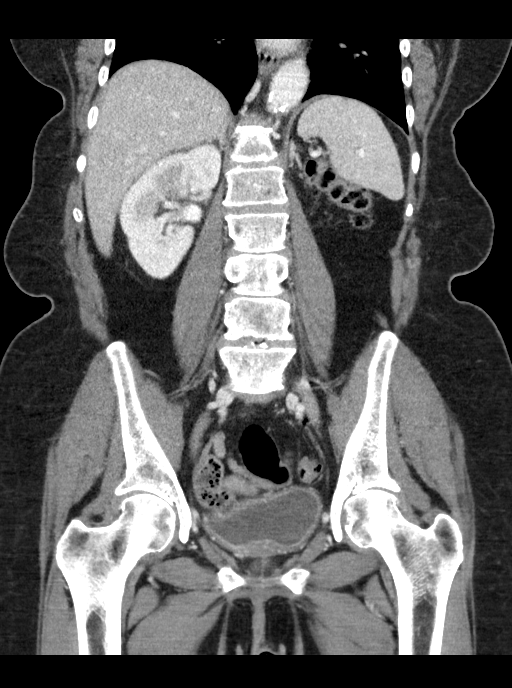
[im 54/98  soft-tissue]
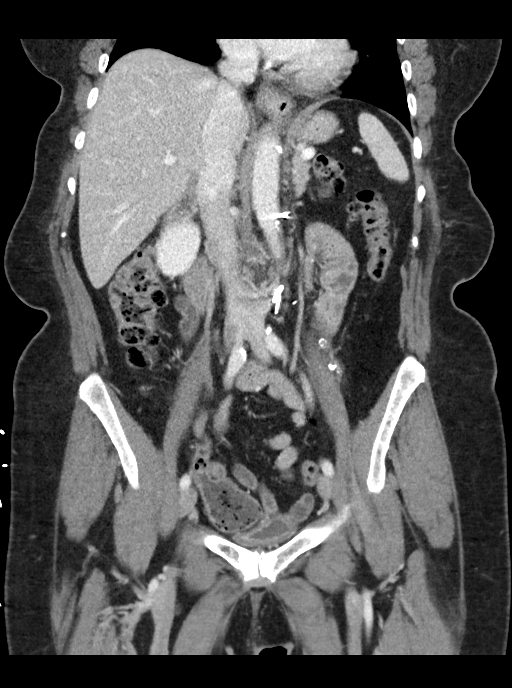

[16 of 46 positions shown; findings below may reference images not displayed]

FINDINGS: Lower chest: The visualized lung bases are clear.

No intra-abdominal free air or free fluid.

Hepatobiliary: No focal liver abnormality is seen. No gallstones,
gallbladder wall thickening, or biliary dilatation.

Pancreas: Unremarkable. No pancreatic ductal dilatation or
surrounding inflammatory changes.

Spleen: Normal in size without focal abnormality.

Adrenals/Urinary Tract: The adrenal glands are unremarkable. Status
post prior left nephrectomy. Subcentimeter right renal hypodense
lesion is not characterized. There is no hydronephrosis. The
visualized ureter is unremarkable. Mild diffuse thickened appearance
of the bladder wall with mucosal enhancement. Correlation with
urinalysis recommended to exclude cystitis.

Stomach/Bowel: There is diffuse colonic diverticulosis without
active inflammatory changes. There is no bowel obstruction or active
inflammation. Appendectomy.

Vascular/Lymphatic: The abdominal aorta and IVC are unremarkable. No
portal venous gas. There is no adenopathy.

Reproductive: Hysterectomy.

Other: None

Musculoskeletal: Degenerative changes of the spine and hips. No
acute osseous pathology.
IMPRESSION: 1. Mild diffuse thickened appearance of the bladder wall with
mucosal enhancement. Correlation with urinalysis recommended to
exclude cystitis.
2. Colonic diverticulosis. No bowel obstruction.
3. Status post prior left nephrectomy.

## 2022-11-13 IMAGING — DX DG CHEST 1V PORT
1 series · 1 of 1 positions shown · non-contrast
Comparison: Radiograph 08/23/2012.  CT 07/29/2020

CLINICAL DATA: Questionable sepsis - evaluate for abnormality

Fever and chills.
EXAM:
PORTABLE CHEST 1 VIEW

[chest ap]
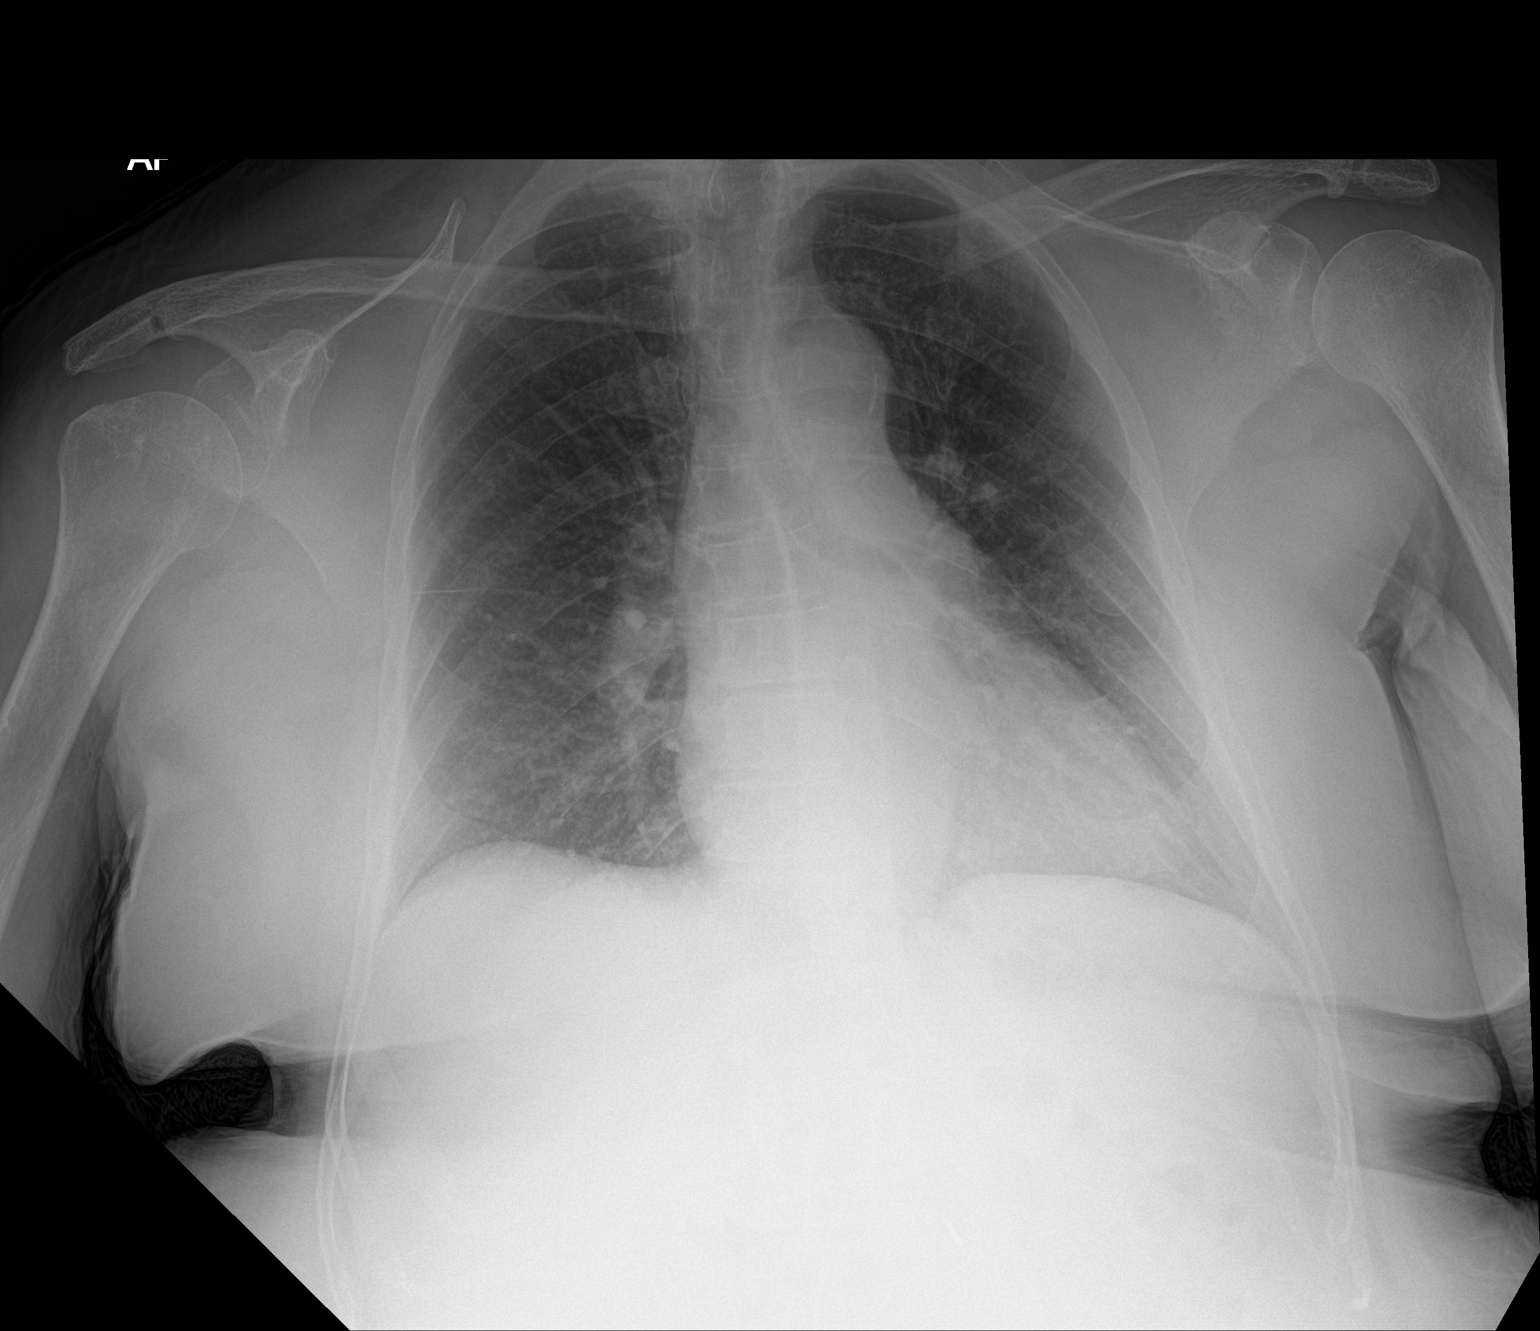

[1 of 1 positions shown; findings below may reference images not displayed]

FINDINGS: The cardiomediastinal contours are normal. Aortic atherosclerosis.
Pulmonary vasculature is normal. No consolidation, pleural effusion,
or pneumothorax. No acute osseous abnormalities are seen.
IMPRESSION: No acute chest findings.

## 2023-01-13 ENCOUNTER — Other Ambulatory Visit: Payer: Self-pay

## 2023-01-13 ENCOUNTER — Encounter (HOSPITAL_BASED_OUTPATIENT_CLINIC_OR_DEPARTMENT_OTHER): Payer: Self-pay

## 2023-01-13 ENCOUNTER — Emergency Department (HOSPITAL_BASED_OUTPATIENT_CLINIC_OR_DEPARTMENT_OTHER)
Admission: EM | Admit: 2023-01-13 | Discharge: 2023-01-13 | Disposition: A | Discharge status: Home / Self Care | Payer: Medicare (Managed Care) | Attending: Emergency Medicine | Admitting: Emergency Medicine

## 2023-01-13 DIAGNOSIS — U071 COVID-19: Secondary | ICD-10-CM | POA: Insufficient documentation

## 2023-01-13 DIAGNOSIS — R0981 Nasal congestion: Secondary | ICD-10-CM | POA: Diagnosis present

## 2023-01-13 LAB — RESP PANEL BY RT-PCR (RSV, FLU A&B, COVID)  RVPGX2
Influenza A by PCR: NEGATIVE
Influenza B by PCR: NEGATIVE
Resp Syncytial Virus by PCR: NEGATIVE
SARS Coronavirus 2 by RT PCR: POSITIVE — AB

## 2023-01-13 NOTE — Discharge Instructions (Signed)
Your viral respiratory panel is still pending.  They are reporting that it should be resulted in the next 3 to 4 hours.  We will attempt to contact you with results, you are welcome to call back for official results.

## 2023-01-13 NOTE — ED Provider Notes (Signed)
[place] [facility] Provider Note   CSN: [id number] Arrival date & time: 01/13/23  8295     History  Chief Complaint  Patient presents with   Nasal Congestion    Tiffany Schultz is a 77 y.o. female.  HPI   77 year old female presents emergency department requesting COVID testing.  Patient states that she has been generally ill since Monday.  She took a home COVID test on Tuesday but she did not know how to read it so it is unclear if this test was positive or negative.  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  She states that her symptoms have improved.  She has no productive cough, fever, vomiting/diarrhea.  Her main complaint was nasal congestion and fatigue which is improving.  Home Medications Prior to Admission medications   Medication Sig Start Date End Date Taking? Authorizing Provider  acetaminophen (TYLENOL) 325 MG tablet Take 2 tablets (650 mg total) by mouth every 6 (six) hours as needed. 12/09/21   Sloan Leiter, DO  amLODipine (NORVASC) 10 MG tablet Take 10 mg by mouth daily. 03/02/21   [provider]  cholecalciferol (VITAMIN D3) 25 MCG (1000 UNIT) tablet Take 3,000 Units by mouth daily.    [provider]  diclofenac Sodium (VOLTAREN) 1 % GEL 2 g daily as needed (pain). 11/27/18   [provider]  Flaxseed, Linseed, (FLAXSEED OIL PO) Take 1 tablet by mouth daily.    [provider]  furosemide (LASIX) 20 MG tablet Take 20 mg by mouth daily. 03/02/21   [provider]  ketorolac (ACULAR) 0.5 % ophthalmic solution Place 1 drop into the right eye 4 (four) times daily.    [provider]  latanoprost (XALATAN) 0.005 % ophthalmic solution Place 1 drop into the left eye at bedtime. 03/22/21   [provider]  moxifloxacin (VIGAMOX) 0.5 % ophthalmic solution Place 1 drop into the right eye in the morning, at noon, in the  evening, and at bedtime.    [provider]  naproxen (NAPROSYN) 250 MG tablet Take 1 tablet (250 mg total) by mouth 2 (two) times daily as needed. 12/09/21   Sloan Leiter, DO  omeprazole (PRILOSEC) 40 MG capsule Take 40 mg by mouth daily. 03/22/21   [provider]  ondansetron (ZOFRAN ODT) 4 MG disintegrating tablet Take 1 tablet (4 mg total) by mouth every 8 (eight) hours as needed for nausea or vomiting. 05/03/21   Kommor, Madison, MD  prednisoLONE acetate (PRED FORTE) 1 % ophthalmic suspension Place 1 drop into the right eye 4 (four) times daily.    [provider]  rosuvastatin (CRESTOR) 5 MG tablet Take 5 mg by mouth daily. 03/06/21   [provider]      Allergies    Morphine and codeine, Penicillins, and Tramadol    Review of Systems   Review of Systems  Constitutional:  Positive for chills and fatigue. Negative for appetite change and fever.  HENT:  Positive for congestion and rhinorrhea.   Respiratory:  Negative for cough, shortness of breath and wheezing.   Cardiovascular:  Negative for chest pain, palpitations and leg swelling.  Gastrointestinal:  Negative for abdominal pain, diarrhea and vomiting.  Skin:  Negative for rash.  Neurological:  Negative for headaches.    Physical Exam Updated Vital Signs BP (!) 162/93 (BP Location: Right Arm)   Pulse 87  Temp 98.5 F (36.9 C) (Oral)   Resp 18   Ht 5\' 1"  (1.549 m)   Wt 64.4 kg   SpO2 98%   BMI 26.83 kg/m  Physical Exam Vitals and nursing note reviewed.  Constitutional:      General: She is not in acute distress.    Appearance: Normal appearance. She is not ill-appearing.  HENT:     Head: Normocephalic.     Mouth/Throat:     Mouth: Mucous membranes are moist.  Cardiovascular:     Rate and Rhythm: Normal rate.  Pulmonary:     Effort: Pulmonary effort is normal. No respiratory distress.     Breath sounds: No wheezing or rales.  Abdominal:     Palpations: Abdomen is soft.      Tenderness: There is no abdominal tenderness.  Skin:    General: Skin is warm.  Neurological:     Mental Status: She is alert and oriented to person, place, and time. Mental status is at baseline.  Psychiatric:        Mood and Affect: Mood normal.     ED Results / Procedures / Treatments   Labs (all labs ordered are listed, but only abnormal results are displayed) Labs Reviewed  RESP PANEL BY RT-PCR (RSV, FLU A&B, COVID)  [id number]    EKG None  Radiology No results found.  Procedures Procedures    Medications Ordered in ED Medications - No data to display  ED Course/ Medical Decision Making/ A&P                                 Medical Decision Making  77 year old female presents emergency department with fatigue, nasal congestion.  Possible positive COVID test on Tuesday however the patient did not know how to read the test.  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  Symptoms are very mild at this time.  Vitals are normal.  Lung sounds are clear.  Respiratory panel was sent.  However her respiratory panel viral testing is currently down, this test is now a send out with result time around 4 hours.  This has been explained to the patient, and that she will have access to her results on MyChart.  Patient understands and is amendable to discharge.  Will otherwise treat herself symptomatically. Symptoms are so mild I would no recommend further treatment. Patient at this time appears safe and stable for discharge and close outpatient follow up. Discharge plan and strict return to ED precautions discussed, patient verbalizes understanding and agreement.        Final Clinical Impression(s) / ED Diagnoses Final diagnoses:  None    Rx / DC Orders ED Discharge Orders     None         Rozelle Logan, DO 01/13/23 4401

## 2023-01-13 NOTE — ED Triage Notes (Signed)
Pt reports to the ED with complaints of feeling weak and tired since Tuesday. Reports that she took at covid test and home and wants to confirm that she is +.

## 2023-01-23 ENCOUNTER — Encounter (HOSPITAL_BASED_OUTPATIENT_CLINIC_OR_DEPARTMENT_OTHER): Payer: Self-pay

## 2023-01-23 ENCOUNTER — Emergency Department (HOSPITAL_BASED_OUTPATIENT_CLINIC_OR_DEPARTMENT_OTHER)
Admission: EM | Admit: 2023-01-23 | Discharge: 2023-01-23 | Disposition: A | Discharge status: Home / Self Care | Payer: Medicare (Managed Care)

## 2023-01-23 ENCOUNTER — Other Ambulatory Visit: Payer: Self-pay

## 2023-01-23 DIAGNOSIS — D72829 Elevated white blood cell count, unspecified: Secondary | ICD-10-CM | POA: Insufficient documentation

## 2023-01-23 DIAGNOSIS — I10 Essential (primary) hypertension: Secondary | ICD-10-CM | POA: Diagnosis not present

## 2023-01-23 DIAGNOSIS — R350 Frequency of micturition: Secondary | ICD-10-CM | POA: Diagnosis present

## 2023-01-23 DIAGNOSIS — N3001 Acute cystitis with hematuria: Secondary | ICD-10-CM | POA: Diagnosis not present

## 2023-01-23 LAB — URINALYSIS, MICROSCOPIC (REFLEX)

## 2023-01-23 LAB — CBC WITH DIFFERENTIAL/PLATELET
Abs Immature Granulocytes: 0.05 10*3/uL (ref 0.00–0.07)
Basophils Absolute: 0.1 10*3/uL (ref 0.0–0.1)
Basophils Relative: 1 %
Eosinophils Absolute: 0.1 10*3/uL (ref 0.0–0.5)
Eosinophils Relative: 1 %
HCT: 39.9 % (ref 36.0–46.0)
Hemoglobin: 13.2 g/dL (ref 12.0–15.0)
Immature Granulocytes: 0 %
Lymphocytes Relative: 16 %
Lymphs Abs: 2.6 10*3/uL (ref 0.7–4.0)
MCH: 29.7 pg (ref 26.0–34.0)
MCHC: 33.1 g/dL (ref 30.0–36.0)
MCV: 89.7 fL (ref 80.0–100.0)
Monocytes Absolute: 0.9 10*3/uL (ref 0.1–1.0)
Monocytes Relative: 6 %
Neutro Abs: 12.1 10*3/uL — ABNORMAL HIGH (ref 1.7–7.7)
Neutrophils Relative %: 76 %
Platelets: 206 10*3/uL (ref 150–400)
RBC: 4.45 MIL/uL (ref 3.87–5.11)
RDW: 11.2 % — ABNORMAL LOW (ref 11.5–15.5)
WBC: 15.9 10*3/uL — ABNORMAL HIGH (ref 4.0–10.5)
nRBC: 0 % (ref 0.0–0.2)

## 2023-01-23 LAB — URINALYSIS, ROUTINE W REFLEX MICROSCOPIC
Bilirubin Urine: NEGATIVE
Glucose, UA: NEGATIVE mg/dL
Ketones, ur: NEGATIVE mg/dL
Nitrite: NEGATIVE
Protein, ur: >=300 mg/dL — AB
Specific Gravity, Urine: 1.025 (ref 1.005–1.030)
pH: 7 (ref 5.0–8.0)

## 2023-01-23 LAB — BASIC METABOLIC PANEL
Anion gap: 10 (ref 5–15)
BUN: 11 mg/dL (ref 8–23)
CO2: 27 mmol/L (ref 22–32)
Calcium: 9.5 mg/dL (ref 8.9–10.3)
Chloride: 103 mmol/L (ref 98–111)
Creatinine, Ser: 0.77 mg/dL (ref 0.44–1.00)
GFR, Estimated: >60 mL/min (ref >60)
Glucose, Bld: 84 mg/dL (ref 70–99)
Potassium: 3.5 mmol/L (ref 3.5–5.1)
Sodium: 140 mmol/L (ref 135–145)

## 2023-01-23 MED ORDER — CEFDINIR 300 MG PO CAPS: 300.0000 mg | ORAL_CAPSULE | Freq: Two times a day (BID) | ORAL | 0 refills | Status: AC

## 2023-01-23 MED ORDER — CEFDINIR 300 MG PO CAPS
300.0000 mg | ORAL_CAPSULE | Freq: Two times a day (BID) | ORAL | Status: DC
  Administered 2023-01-23: 300 mg via ORAL
  Filled 2023-01-23: qty 1

## 2023-01-23 NOTE — ED Triage Notes (Signed)
Pt reports that she is here for a medication refill for UTI.

## 2023-01-23 NOTE — ED Notes (Signed)
Lab notified to add-on urine culture to previously collected urine sample.  

## 2023-01-23 NOTE — ED Provider Notes (Signed)
[place] [facility] AT [facility] Provider Note   CSN: [id number] Arrival date & time: 01/23/23  1213     History  Chief Complaint  Patient presents with   Urinary Frequency    Tiffany Schultz is a 77 y.o. female.  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  Patient has not noted hematuria.  She has not had any fevers.  She has had some mild right-sided abdominal discomfort.  She relates that symptoms are very similar to previous UTI.  She was seen in the emergency room February 2024 with similar symptoms.  She was treated with cefdinir.  Culture grew out E. coli, resistant to penicillin only.       Home Medications Prior to Admission medications   Medication Sig Start Date End Date Taking? Authorizing Provider  acetaminophen (TYLENOL) 325 MG tablet Take 2 tablets (650 mg total) by mouth every 6 (six) hours as needed. 12/09/21   Sloan Leiter, DO  amLODipine (NORVASC) 10 MG tablet Take 10 mg by mouth daily. 03/02/21   [provider]  cholecalciferol (VITAMIN D3) 25 MCG (1000 UNIT) tablet Take 3,000 Units by mouth daily.    [provider]  diclofenac Sodium (VOLTAREN) 1 % GEL 2 g daily as needed (pain). 11/27/18   [provider]  Flaxseed, Linseed, (FLAXSEED OIL PO) Take 1 tablet by mouth daily.    [provider]  furosemide (LASIX) 20 MG tablet Take 20 mg by mouth daily. 03/02/21   [provider]  ketorolac (ACULAR) 0.5 % ophthalmic solution Place 1 drop into the right eye 4 (four) times daily.    [provider]  latanoprost (XALATAN) 0.005 % ophthalmic solution Place 1 drop into the left eye at bedtime. 03/22/21   [provider]  moxifloxacin (VIGAMOX)  0.5 % ophthalmic solution Place 1 drop into the right eye in the morning, at noon, in the evening, and at bedtime.    [provider]  naproxen (NAPROSYN) 250 MG tablet Take 1 tablet (250 mg total) by mouth 2 (two) times daily as needed. 12/09/21   Sloan Leiter, DO  omeprazole (PRILOSEC) 40 MG capsule Take 40 mg by mouth daily. 03/22/21   [provider]  ondansetron (ZOFRAN ODT) 4 MG disintegrating tablet Take 1 tablet (4 mg total) by mouth every 8 (eight) hours as needed for nausea or vomiting. 05/03/21   Kommor, Madison, MD  prednisoLONE acetate (PRED FORTE) 1 % ophthalmic suspension Place 1 drop into the right eye 4 (four) times daily.    [provider]  rosuvastatin (CRESTOR) 5 MG tablet Take 5 mg by mouth daily. 03/06/21   [provider]      Allergies    Morphine and codeine, Penicillins, and Tramadol    Review of Systems   Review of Systems  Physical Exam Updated Vital Signs BP (!) 139/99 (BP Location: Right Arm)   Pulse 77   Temp 97.7 F (36.5 C) (Oral)   Resp 17   Ht 5\' 1"  (1.549 m)   Wt 64.4 kg   SpO2 100%   BMI 26.83 kg/m  Physical Exam Vitals and nursing note reviewed.  Constitutional:      General: She is not in acute distress.    Appearance: She is well-developed.  HENT:     Head: Normocephalic and atraumatic.     Right Ear: External ear normal.     Left Ear: External ear normal.     Nose: Nose normal.  Eyes:     Conjunctiva/sclera: Conjunctivae normal.  Cardiovascular:     Rate and Rhythm: Normal rate and regular rhythm.     Heart sounds: No murmur heard. Pulmonary:     Effort: No respiratory distress.     Breath sounds: No wheezing, rhonchi or rales.  Abdominal:     Palpations: Abdomen is soft.     Tenderness: There is no abdominal tenderness. There is no right CVA tenderness, left CVA tenderness, guarding or rebound.     Comments: No CVA tenderness or abdominal tenderness  Musculoskeletal:     Cervical back:  Normal range of motion and neck supple.     Right lower leg: No edema.     Left lower leg: No edema.  Skin:    General: Skin is warm and dry.     Findings: No rash.  Neurological:     General: No focal deficit present.     Mental Status: She is alert. Mental status is at baseline.     Motor: No weakness.  Psychiatric:        Mood and Affect: Mood normal.     ED Results / Procedures / Treatments   Labs (all labs ordered are listed, but only abnormal results are displayed) Labs Reviewed  URINALYSIS, ROUTINE W REFLEX MICROSCOPIC - Abnormal; Notable for the following components:      Result Value   Hgb urine dipstick LARGE (*)    Protein, ur >=300 (*)    Leukocytes,Ua SMALL (*)    All other components within normal limits  URINALYSIS, MICROSCOPIC (REFLEX) - Abnormal; Notable for the following components:   Bacteria, UA FEW (*)    All other components within normal limits  URINE CULTURE  CBC WITH DIFFERENTIAL/PLATELET  BASIC METABOLIC PANEL    EKG None  Radiology No results found.  Procedures Procedures    Medications Ordered in ED Medications  cefdinir (OMNICEF) capsule 300 mg (300 mg Oral Given 01/23/23 1353)    ED Course/ Medical Decision Making/ A&P    Patient seen and examined. History obtained directly from patient.  Reviewed previous hospitalization notes and ED note as well as urine culture reports.  Labs/EKG: Ordered CBC, BMP.  UA personally reviewed and interpreted, shows microscopic hematuria as well as a few white blood cells.  Increased protein today.  Imaging: None ordered, discussed CT imaging with patient and she prefers to defer at this time  Medications/Fluids: Ordered: Oral cefdinir.   Most recent vital signs reviewed and are as follows: BP (!) 139/99 (BP Location: Right Arm)   Pulse 77   Temp 97.7 F (36.5 C) (Oral)   Resp 17   Ht 5\' 1"  (1.549 m)   Wt 64.4 kg   SpO2 100%   BMI 26.83 kg/m   Initial impression: Likely UTI, but more  blood and protein in urine today.  Awaiting lab work.  Patient appears comfortable.  No CVA tenderness or fevers.  No tachycardia.  Low concern for sepsis or pyelonephritis at this time.  However she is high risk due to her solitary kidney status.  3:47 PM   Labs personally reviewed and interpreted including: CBC with elevated white blood cell count at 15.9 and elevated neutrophils; BMP demonstrates normal creatinine.  Most current vital signs reviewed and are as  follows: BP (!) 135/96   Pulse 78   Temp 97.7 F (36.5 C) (Oral)   Resp 15   Ht 5\' 1"  (1.549 m)   Wt 64.4 kg   SpO2 98%   BMI 26.83 kg/m   Plan: Discharge to home.   Prescriptions written for: Cefdinir  Other home care instructions discussed: Close monitoring of symptoms  ED return instructions discussed: Fever, vomiting, uncontrolled pain, new symptoms or other concerns  Follow-up instructions discussed: Patient encouraged to follow-up with their PCP in 2-3 days, for recheck, follow-up on culture results.                                  Medical Decision Making Amount and/or Complexity of Data Reviewed Labs: ordered.  Risk Prescription drug management.   Very well-appearing patient with irritative UTI symptoms.  White and red cells noted in urine.  No significant pain at time of exam.  Given history of solitary kidney, check basic labs which shows normal kidney function.  Reviewed previous urine cultures.  Patient will be placed on cefdinir and she is given a dose in the ED.  She seems reliable to return if worsening.  She does have history of bacteremia due to UTI, but no fevers, tachycardia, toxic appearance and overall low concern for sepsis at this time.  The patient's vital signs, pertinent lab work and imaging were reviewed and interpreted as discussed in the ED course. Hospitalization was considered for further testing, treatments, or serial exams/observation. However as patient is well-appearing, has a  stable exam, and reassuring studies today, I do not feel that they warrant admission at this time. This plan was discussed with the patient who verbalizes agreement and comfort with this plan and seems reliable and able to return to the Emergency Department with worsening or changing symptoms.          Final Clinical Impression(s) / ED Diagnoses Final diagnoses:  Acute cystitis with hematuria    Rx / DC Orders ED Discharge Orders          Ordered    cefdinir (OMNICEF) 300 MG capsule  2 times daily        01/23/23 1524              Renne Crigler, PA-C 01/23/23 1549    Coral Spikes, DO 01/24/23 [phone]

## 2023-01-23 NOTE — Discharge Instructions (Signed)
Please read and follow all provided instructions.  Your diagnoses today include:  1. Acute cystitis with hematuria     Tests performed today include: Urine test - suggests that you have an infection in your bladder with blood noted as well Blood cell counts electrolytes: Shows normal kidney function, elevated white blood cell count Urine culture pending Vital signs. See below for your results today.   Medications prescribed:  Omnicef: Antibiotic for UTI  Home care instructions:  Follow any educational materials contained in this packet.  Follow-up instructions: Please follow-up with your primary care provider in 3 days for follow-up of urine culture and reevaluation.  Return instructions:  Please return to the Emergency Department if you experience worsening symptoms.  Return with fever, worsening pain, persistent vomiting, worsening pain in your back.  Please return if you have any other emergent concerns.  Additional Information:  Your vital signs today were: BP (!) 139/99 (BP Location: Right Arm)   Pulse 77   Temp 97.7 F (36.5 C) (Oral)   Resp 17   Ht 5\' 1"  (1.549 m)   Wt 64.4 kg   SpO2 100%   BMI 26.83 kg/m  If your blood pressure (BP) was elevated above 135/85 this visit, please have this repeated by your doctor within one month. --------------

## 2023-01-23 NOTE — ED Notes (Signed)
D/c paperwork reviewed with pt, including prescriptions and follow up care.  All questions and/or concerns addressed at time of d/c.  No further needs expressed. . Pt verbalized understanding, Ambulatory without assistance to ED exit, NAD.   

## 2023-01-23 NOTE — ED Notes (Signed)
2nd urine specimen sent to lab.

## 2023-08-05 ENCOUNTER — Other Ambulatory Visit: Payer: Self-pay

## 2023-08-05 ENCOUNTER — Emergency Department (HOSPITAL_BASED_OUTPATIENT_CLINIC_OR_DEPARTMENT_OTHER)
Admission: EM | Admit: 2023-08-05 | Discharge: 2023-08-05 | Disposition: A | Discharge status: Home / Self Care | Payer: Medicare (Managed Care) | Attending: Emergency Medicine | Admitting: Emergency Medicine

## 2023-08-05 ENCOUNTER — Encounter (HOSPITAL_BASED_OUTPATIENT_CLINIC_OR_DEPARTMENT_OTHER): Payer: Self-pay | Admitting: *Deleted

## 2023-08-05 DIAGNOSIS — B0222 Postherpetic trigeminal neuralgia: Secondary | ICD-10-CM | POA: Diagnosis not present

## 2023-08-05 DIAGNOSIS — I10 Essential (primary) hypertension: Secondary | ICD-10-CM | POA: Diagnosis not present

## 2023-08-05 DIAGNOSIS — R519 Headache, unspecified: Secondary | ICD-10-CM | POA: Diagnosis not present

## 2023-08-05 DIAGNOSIS — M791 Myalgia, unspecified site: Secondary | ICD-10-CM | POA: Diagnosis present

## 2023-08-05 DIAGNOSIS — B0229 Other postherpetic nervous system involvement: Secondary | ICD-10-CM

## 2023-08-05 DIAGNOSIS — Z79899 Other long term (current) drug therapy: Secondary | ICD-10-CM | POA: Insufficient documentation

## 2023-08-05 LAB — RESP PANEL BY RT-PCR (RSV, FLU A&B, COVID)  RVPGX2
Influenza A by PCR: NEGATIVE
Influenza B by PCR: NEGATIVE
Resp Syncytial Virus by PCR: NEGATIVE
SARS Coronavirus 2 by RT PCR: NEGATIVE

## 2023-08-05 MED ORDER — DOXYCYCLINE HYCLATE 100 MG PO CAPS: 100.0000 mg | ORAL_CAPSULE | Freq: Two times a day (BID) | ORAL | 0 refills | Status: AC

## 2023-08-05 MED ORDER — GABAPENTIN 300 MG PO CAPS
300.0000 mg | ORAL_CAPSULE | Freq: Once | ORAL | Status: AC
  Administered 2023-08-05: 300 mg via ORAL
  Filled 2023-08-05: qty 1

## 2023-08-05 MED ORDER — GABAPENTIN 300 MG PO CAPS: ORAL_CAPSULE | ORAL | 0 refills | Status: AC

## 2023-08-05 MED ORDER — DOXYCYCLINE HYCLATE 100 MG PO TABS
100.0000 mg | ORAL_TABLET | Freq: Once | ORAL | Status: AC
  Administered 2023-08-05: 100 mg via ORAL
  Filled 2023-08-05: qty 1

## 2023-08-05 MED ORDER — GABAPENTIN 100 MG PO CAPS: 100.0000 mg | ORAL_CAPSULE | Freq: Once | ORAL | Status: DC

## 2023-08-05 MED ORDER — ACETAMINOPHEN 325 MG PO TABS
650.0000 mg | ORAL_TABLET | Freq: Once | ORAL | Status: AC
  Administered 2023-08-05: 650 mg via ORAL
  Filled 2023-08-05: qty 2

## 2023-08-05 NOTE — ED Triage Notes (Signed)
 Pt was diagnosed with shingles last week and she has had severe pain and she states that it is making it difficult to sleep.  She also reports headache with this.

## 2023-08-05 NOTE — ED Provider Notes (Addendum)
 [place] [facility] AT [facility] Provider Note   CSN: [id number] Arrival date & time: 08/05/23  1549     History  Chief Complaint  Patient presents with   Herpes Zoster    Tiffany Schultz is a 78 y.o. female.  Patient here with pain along her shingles site.  Having pain, all over her difficulty with sleep some headaches.  She has not really been able to sleep the last couple days due to the pain along her rash site.  She is on Valtrex.  She has history of hypertension.  She denies any weakness numbness tingling.  Denies any fevers or chills.  Denies any chest pain shortness of breath.  Denies any vision loss speech changes nausea vomiting diarrhea.  The history is provided by the patient.       Home Medications Prior to Admission medications   Medication Sig Start Date End Date Taking? Authorizing Provider  doxycycline (VIBRAMYCIN) 100 MG capsule Take 1 capsule (100 mg total) by mouth 2 (two) times daily. 08/05/23  Yes Ame Heagle, DO  gabapentin (NEURONTIN) 300 MG capsule Take 300 mg twice on day 2 and then 300 mg 3 times on day 3 until completed 08/05/23  Yes Davit Vassar, DO  acetaminophen (TYLENOL) 325 MG tablet Take 2 tablets (650 mg total) by mouth every 6 (six) hours as needed. 12/09/21   Sloan Leiter, DO  amLODipine (NORVASC) 10 MG tablet Take 10 mg by mouth daily. 03/02/21   [provider]  cefdinir (OMNICEF) 300 MG capsule Take 1 capsule (300 mg total) by mouth 2 (two) times daily. 01/23/23   Renne Crigler, PA-C  cholecalciferol (VITAMIN D3) 25 MCG (1000 UNIT) tablet Take 3,000 Units by mouth daily.    [provider]  diclofenac Sodium (VOLTAREN) 1 % GEL 2 g daily as needed (pain). 11/27/18   [provider]  Flaxseed, Linseed, (FLAXSEED OIL PO) Take 1 tablet by mouth daily.    [provider]  furosemide (LASIX) 20 MG tablet Take 20 mg by mouth daily. 03/02/21   [provider]  ketorolac (ACULAR) 0.5  % ophthalmic solution Place 1 drop into the right eye 4 (four) times daily.    [provider]  latanoprost (XALATAN) 0.005 % ophthalmic solution Place 1 drop into the left eye at bedtime. 03/22/21   [provider]  moxifloxacin (VIGAMOX) 0.5 % ophthalmic solution Place 1 drop into the right eye in the morning, at noon, in the evening, and at bedtime.    [provider]  naproxen (NAPROSYN) 250 MG tablet Take 1 tablet (250 mg total) by mouth 2 (two) times daily as needed. 12/09/21   Sloan Leiter, DO  omeprazole (PRILOSEC) 40 MG capsule Take 40 mg by mouth daily. 03/22/21   [provider]  ondansetron (ZOFRAN ODT) 4 MG disintegrating tablet Take 1 tablet (4 mg total) by mouth every 8 (eight) hours as needed for nausea or vomiting. 05/03/21   Kommor, Madison, MD  prednisoLONE acetate (PRED FORTE) 1 % ophthalmic suspension Place 1 drop into the right eye 4 (four) times daily.    [provider]  rosuvastatin (CRESTOR) 5 MG tablet Take 5 mg by mouth daily. 03/06/21   [provider]      Allergies    Morphine and codeine, Penicillins, and Tramadol    Review of Systems   Review of Systems  Physical Exam Updated Vital Signs BP 130/89   Pulse (!) 110  Temp 99.6 F (37.6 C) (Oral)   Resp 16   SpO2 97%  Physical Exam Vitals and nursing note reviewed.  Constitutional:      General: She is not in acute distress.    Appearance: She is well-developed. She is not ill-appearing.  HENT:     Head: Normocephalic and atraumatic.     Nose: Nose normal.  Eyes:     Extraocular Movements: Extraocular movements intact.     Conjunctiva/sclera: Conjunctivae normal.     Pupils: Pupils are equal, round, and reactive to light.  Cardiovascular:     Rate and Rhythm: Normal rate and regular rhythm.     Pulses: Normal pulses.     Heart sounds: Normal heart sounds. No murmur heard. Pulmonary:     Effort: Pulmonary effort is normal. No respiratory  distress.     Breath sounds: Normal breath sounds.  Abdominal:     Palpations: Abdomen is soft.     Tenderness: There is no abdominal tenderness.  Musculoskeletal:        General: No swelling.     Cervical back: Normal range of motion and neck supple.  Skin:    General: Skin is warm and dry.     Capillary Refill: Capillary refill takes less than 2 seconds.     Comments: Zoster type rash to the left mid chest wall to the left back may be a small area of cellulitis around the zoster rash on the posterior part  Neurological:     General: No focal deficit present.     Mental Status: She is alert and oriented to person, place, and time.     Cranial Nerves: No cranial nerve deficit.     Sensory: No sensory deficit.     Motor: No weakness.     Coordination: Coordination normal.     Comments: 5+ out of 5 strength throughout, normal sensation, no drift, normal finger-nose-finger, normal speech  Psychiatric:        Mood and Affect: Mood normal.     ED Results / Procedures / Treatments   Labs (all labs ordered are listed, but only abnormal results are displayed) Labs Reviewed  RESP PANEL BY RT-PCR (RSV, FLU A&B, COVID)  [id number]    EKG None  Radiology No results found.  Procedures Procedures    Medications Ordered in ED Medications  acetaminophen (TYLENOL) tablet 650 mg (650 mg Oral Given 08/05/23 1917)  gabapentin (NEURONTIN) capsule 300 mg (300 mg Oral Given 08/05/23 1917)  doxycycline (VIBRA-TABS) tablet 100 mg (100 mg Oral Given 08/05/23 1921)    ED Course/ Medical Decision Making/ A&P                                 Medical Decision Making Risk OTC drugs. Prescription drug management.   Tiffany Schultz is here with ongoing pain from shingles area.  History of hypertension reflux.  Overall unremarkable vitals.  She does have shingles type rash to her mid left anterior chest wall and posterior chest wall.  There does appear to maybe be a cellulitic portion on the left  posterior portion we will do doxycycline.  She is neurologically intact well-appearing otherwise.  She has not been able to sleep the last couple days because of discomfort in her rash site.  Will start her on gabapentin given her Tylenol.  Recommend Tylenol gabapentin cream that she was given for this as well by her primary doctor.  She is already on Valtrex.  Neurologically she is intact.  I do not think there is any complicating zoster process at this time.  She understands return precautions.  Discharged in good condition.  Recommend follow-up with her primary care doctor this week.  This chart was dictated using voice recognition software.  Despite best efforts to proofread,  errors can occur which can change the documentation meaning.         Final Clinical Impression(s) / ED Diagnoses Final diagnoses:  Post herpetic neuralgia    Rx / DC Orders ED Discharge Orders          Ordered    gabapentin (NEURONTIN) 300 MG capsule        08/05/23 1902    doxycycline (VIBRAMYCIN) 100 MG capsule  2 times daily        08/05/23 1919              Virgina Norfolk, DO 08/05/23 1925    Virgina Norfolk, DO 08/05/23 1926

## 2023-08-05 NOTE — ED Notes (Signed)

## 2023-08-05 NOTE — Discharge Instructions (Signed)
 All in all 650 mg every 6 hours as needed for pain.  Take gabapentin as prescribed to help with discomfort as well.  Follow-up with your primary care doctor.  Return if symptoms worsen.  You can take gabapentin 300 mg twice tomorrow and then on day 3 you can take 300 mg 3 times a day until your medications completed.  Follow-up with your primary care doctor early next week for reevaluation.  Return if symptoms worsen.

## 2023-08-28 NOTE — Progress Notes (Signed)
 ok

## 2024-03-10 ENCOUNTER — Emergency Department (HOSPITAL_BASED_OUTPATIENT_CLINIC_OR_DEPARTMENT_OTHER): Payer: Medicare (Managed Care)

## 2024-03-10 ENCOUNTER — Other Ambulatory Visit: Payer: Self-pay

## 2024-03-10 ENCOUNTER — Encounter (HOSPITAL_BASED_OUTPATIENT_CLINIC_OR_DEPARTMENT_OTHER): Payer: Self-pay

## 2024-03-10 ENCOUNTER — Emergency Department (HOSPITAL_BASED_OUTPATIENT_CLINIC_OR_DEPARTMENT_OTHER)
Admission: EM | Admit: 2024-03-10 | Discharge: 2024-03-10 | Disposition: A | Discharge status: Home / Self Care | Payer: Medicare (Managed Care) | Attending: Emergency Medicine | Admitting: Emergency Medicine

## 2024-03-10 DIAGNOSIS — S46012A Strain of muscle(s) and tendon(s) of the rotator cuff of left shoulder, initial encounter: Secondary | ICD-10-CM

## 2024-03-10 DIAGNOSIS — I10 Essential (primary) hypertension: Secondary | ICD-10-CM | POA: Diagnosis not present

## 2024-03-10 DIAGNOSIS — Z79899 Other long term (current) drug therapy: Secondary | ICD-10-CM | POA: Diagnosis not present

## 2024-03-10 DIAGNOSIS — S43422A Sprain of left rotator cuff capsule, initial encounter: Secondary | ICD-10-CM | POA: Insufficient documentation

## 2024-03-10 DIAGNOSIS — M25512 Pain in left shoulder: Secondary | ICD-10-CM

## 2024-03-10 DIAGNOSIS — S4992XA Unspecified injury of left shoulder and upper arm, initial encounter: Secondary | ICD-10-CM | POA: Diagnosis present

## 2024-03-10 DIAGNOSIS — W1809XA Striking against other object with subsequent fall, initial encounter: Secondary | ICD-10-CM | POA: Insufficient documentation

## 2024-03-10 MED ORDER — CYCLOBENZAPRINE HCL 5 MG PO TABS: 5.0000 mg | ORAL_TABLET | Freq: Three times a day (TID) | ORAL | 0 refills | Status: AC | PRN

## 2024-03-10 NOTE — ED Triage Notes (Signed)
 Pt ambulatory to triage.. reports on a 2 step stool reaching for item in cabinet and as she was stepping back foot became caught in her gown causing her fall on left shoulder. Hit head slighty. Denies LOC. No thinners. No obvious deformity

## 2024-03-10 NOTE — ED Provider Notes (Signed)
 [place] [facility] Provider Note   CSN: [id number] Arrival date & time: 03/10/24  1146     Patient presents with: Tiffany Schultz is a 78 y.o. female.  {Add pertinent medical, surgical, social history, OB history to HPI:32947} HPI     Prior to Admission medications   Medication Sig Start Date End Date Taking? Authorizing Provider  acetaminophen  (TYLENOL ) 325 MG tablet Take 2 tablets (650 mg total) by mouth every 6 (six) hours as needed. 12/09/21   Elnor Jayson LABOR, DO  amLODipine  (NORVASC ) 10 MG tablet Take 10 mg by mouth daily. 03/02/21   [provider]  cefdinir  (OMNICEF ) 300 MG capsule Take 1 capsule (300 mg total) by mouth 2 (two) times daily. 01/23/23   Desiderio Chew, PA-C  cholecalciferol (VITAMIN D3) 25 MCG (1000 UNIT) tablet Take 3,000 Units by mouth daily.    [provider]  diclofenac Sodium (VOLTAREN) 1 % GEL 2 g daily as needed (pain). 11/27/18   [provider]  doxycycline  (VIBRAMYCIN ) 100 MG capsule Take 1 capsule (100 mg total) by mouth 2 (two) times daily. 08/05/23   Curatolo, Adam, DO  Flaxseed, Linseed, (FLAXSEED OIL PO) Take 1 tablet by mouth daily.    [provider]  furosemide (LASIX) 20 MG tablet Take 20 mg by mouth daily. 03/02/21   [provider]  gabapentin  (NEURONTIN ) 300 MG capsule Take 300 mg twice on day 2 and then 300 mg 3 times on day 3 until completed 08/05/23   Curatolo, Adam, DO  ketorolac  (ACULAR ) 0.5 % ophthalmic solution Place 1 drop into the right eye 4 (four) times daily.    [provider]  latanoprost  (XALATAN ) 0.005 % ophthalmic solution Place 1 drop into the left eye at bedtime. 03/22/21   [provider]  moxifloxacin (VIGAMOX) 0.5 % ophthalmic solution Place 1 drop into the right eye in the morning, at noon, in the evening, and at bedtime.    [provider]  naproxen  (NAPROSYN ) 250 MG tablet Take 1 tablet (250 mg total) by mouth 2  (two) times daily as needed. 12/09/21   Elnor Jayson LABOR, DO  omeprazole (PRILOSEC) 40 MG capsule Take 40 mg by mouth daily. 03/22/21   [provider]  ondansetron  (ZOFRAN  ODT) 4 MG disintegrating tablet Take 1 tablet (4 mg total) by mouth every 8 (eight) hours as needed for nausea or vomiting. 05/03/21   Kommor, Madison, MD  prednisoLONE acetate (PRED FORTE) 1 % ophthalmic suspension Place 1 drop into the right eye 4 (four) times daily.    [provider]  rosuvastatin  (CRESTOR ) 5 MG tablet Take 5 mg by mouth daily. 03/06/21   [provider]    Allergies: Morphine and codeine, Penicillins, and Tramadol    Review of Systems  Updated Vital Signs BP (!) 146/93   Pulse 75   Temp 98.4 F (36.9 C)   Resp 16   SpO2 97%   Physical Exam  (all labs ordered are listed, but only abnormal results are displayed) Labs Reviewed - No data to display  EKG: None  Radiology: DG Shoulder Left Result Date: 03/10/2024 EXAM: 1 VIEW XRAY OF THE LEFT SHOULDER 03/10/2024 12:31:00 PM COMPARISON: None available. CLINICAL HISTORY: fall onto left shoulder. Fell off step stool landing on left shoulder FINDINGS: BONES AND JOINTS: Glenohumeral joint is normally aligned. No acute fracture or dislocation. Mild arthropathy of the acromioclavicular joint. SOFT TISSUES: No abnormal calcifications. Visualized lung is unremarkable.  IMPRESSION: 1. No acute fracture or dislocation. Electronically signed by: Waddell Calk MD 03/10/2024 12:54 PM EDT RP Workstation: [id number]    {Document cardiac monitor, telemetry assessment procedure when appropriate:32947} Procedures   Medications Ordered in the ED - No data to display    {Click here for ABCD2, HEART and other calculators REFRESH Note before signing:1}                              Medical Decision Making Amount and/or Complexity of Data Reviewed Radiology: ordered.   ***  {Document critical care time when appropriate  Document review  of labs and clinical decision tools ie [id number], etc  Document your independent review of radiology images and any outside records  Document your discussion with family members, caretakers and with consultants  Document social determinants of health affecting pt's care  Document your decision making why or why not admission, treatments were needed:32947:::1}   Final diagnoses:  None    ED Discharge Orders     None

## 2024-04-12 ENCOUNTER — Emergency Department (HOSPITAL_BASED_OUTPATIENT_CLINIC_OR_DEPARTMENT_OTHER): Payer: Medicare (Managed Care)

## 2024-04-12 ENCOUNTER — Other Ambulatory Visit: Payer: Self-pay

## 2024-04-12 ENCOUNTER — Encounter (HOSPITAL_BASED_OUTPATIENT_CLINIC_OR_DEPARTMENT_OTHER): Payer: Self-pay

## 2024-04-12 ENCOUNTER — Emergency Department (HOSPITAL_BASED_OUTPATIENT_CLINIC_OR_DEPARTMENT_OTHER)
Admission: EM | Admit: 2024-04-12 | Discharge: 2024-04-12 | Disposition: A | Discharge status: Home / Self Care | Payer: Medicare (Managed Care) | Attending: Emergency Medicine | Admitting: Emergency Medicine

## 2024-04-12 DIAGNOSIS — W19XXXA Unspecified fall, initial encounter: Secondary | ICD-10-CM

## 2024-04-12 DIAGNOSIS — Y92210 Daycare center as the place of occurrence of the external cause: Secondary | ICD-10-CM | POA: Diagnosis not present

## 2024-04-12 DIAGNOSIS — W010XXA Fall on same level from slipping, tripping and stumbling without subsequent striking against object, initial encounter: Secondary | ICD-10-CM | POA: Diagnosis not present

## 2024-04-12 DIAGNOSIS — S46912A Strain of unspecified muscle, fascia and tendon at shoulder and upper arm level, left arm, initial encounter: Secondary | ICD-10-CM | POA: Insufficient documentation

## 2024-04-12 DIAGNOSIS — M25512 Pain in left shoulder: Secondary | ICD-10-CM | POA: Diagnosis present

## 2024-04-12 HISTORY — DX: Unspecified glaucoma: H40.9

## 2024-04-12 MED ORDER — ACETAMINOPHEN 500 MG PO TABS
1000.0000 mg | ORAL_TABLET | Freq: Once | ORAL | Status: AC
  Administered 2024-04-12: 1000 mg via ORAL
  Filled 2024-04-12: qty 2

## 2024-04-12 NOTE — ED Provider Notes (Signed)
 [facility] Provider Note   CSN: [id number] Arrival date & time: 04/12/24  1012     Patient presents with: Tiffany Schultz is a 78 y.o. female.   Patient presents with left shoulder pain since fall while lying a child down and she tripped.  No head or other significant injuries.  Pain with range of motion.  History of recent rotator cuff injury and MRI of the left shoulder.  The history is provided by the patient.  Fall Pertinent negatives include no chest pain, no abdominal pain, no headaches and no shortness of breath.       Prior to Admission medications   Medication Sig Start Date End Date Taking? Authorizing Provider  acetaminophen  (TYLENOL ) 325 MG tablet Take 2 tablets (650 mg total) by mouth every 6 (six) hours as needed. 12/09/21   Elnor Jayson LABOR, DO  amLODipine  (NORVASC ) 10 MG tablet Take 10 mg by mouth daily. 03/02/21   [provider]  cefdinir  (OMNICEF ) 300 MG capsule Take 1 capsule (300 mg total) by mouth 2 (two) times daily. 01/23/23   Desiderio Chew, PA-C  cholecalciferol (VITAMIN D3) 25 MCG (1000 UNIT) tablet Take 3,000 Units by mouth daily.    [provider]  cyclobenzaprine (FLEXERIL) 5 MG tablet Take 1 tablet (5 mg total) by mouth 3 (three) times daily as needed for muscle spasms. 03/10/24   Dreama Longs, MD  diclofenac Sodium (VOLTAREN) 1 % GEL 2 g daily as needed (pain). 11/27/18   [provider]  doxycycline  (VIBRAMYCIN ) 100 MG capsule Take 1 capsule (100 mg total) by mouth 2 (two) times daily. 08/05/23   Curatolo, Adam, DO  Flaxseed, Linseed, (FLAXSEED OIL PO) Take 1 tablet by mouth daily.    [provider]  furosemide (LASIX) 20 MG tablet Take 20 mg by mouth daily. 03/02/21   [provider]  gabapentin  (NEURONTIN ) 300 MG capsule Take 300 mg twice on day 2 and then 300 mg 3 times on day 3 until completed 08/05/23   Curatolo, Adam, DO  ketorolac  (ACULAR ) 0.5 %  ophthalmic solution Place 1 drop into the right eye 4 (four) times daily.    [provider]  latanoprost  (XALATAN ) 0.005 % ophthalmic solution Place 1 drop into the left eye at bedtime. 03/22/21   [provider]  moxifloxacin (VIGAMOX) 0.5 % ophthalmic solution Place 1 drop into the right eye in the morning, at noon, in the evening, and at bedtime.    [provider]  naproxen  (NAPROSYN ) 250 MG tablet Take 1 tablet (250 mg total) by mouth 2 (two) times daily as needed. 12/09/21   Elnor Jayson LABOR, DO  omeprazole (PRILOSEC) 40 MG capsule Take 40 mg by mouth daily. 03/22/21   [provider]  ondansetron  (ZOFRAN  ODT) 4 MG disintegrating tablet Take 1 tablet (4 mg total) by mouth every 8 (eight) hours as needed for nausea or vomiting. 05/03/21   Kommor, Madison, MD  prednisoLONE acetate (PRED FORTE) 1 % ophthalmic suspension Place 1 drop into the right eye 4 (four) times daily.    [provider]  rosuvastatin  (CRESTOR ) 5 MG tablet Take 5 mg by mouth daily. 03/06/21   [provider]    Allergies: Morphine and codeine, Penicillins, and Tramadol    Review of Systems  Constitutional:  Negative for chills and fever.  HENT:  Negative for congestion.   Eyes:  Negative for visual disturbance.  Respiratory:  Negative for shortness  of breath.   Cardiovascular:  Negative for chest pain.  Gastrointestinal:  Negative for abdominal pain and vomiting.  Genitourinary:  Negative for dysuria and flank pain.  Musculoskeletal:  Negative for back pain, neck pain and neck stiffness.  Skin:  Negative for rash.  Neurological:  Negative for light-headedness and headaches.    Updated Vital Signs BP (!) 137/94 (BP Location: Right Arm)   Pulse 94   Temp 97.9 F (36.6 C) (Oral)   Resp 17   Wt 64.4 kg   SpO2 97%   BMI 26.83 kg/m   Physical Exam Vitals and nursing note reviewed.  Constitutional:      General: She is not in acute distress.    Appearance: She  is well-developed.  HENT:     Head: Normocephalic and atraumatic.     Mouth/Throat:     Mouth: Mucous membranes are moist.  Eyes:     General:        Right eye: No discharge.        Left eye: No discharge.  Neck:     Trachea: No tracheal deviation.  Cardiovascular:     Rate and Rhythm: Normal rate and regular rhythm.  Pulmonary:     Effort: Pulmonary effort is normal.  Abdominal:     General: There is no distension.  Musculoskeletal:        General: Tenderness present. No swelling.     Cervical back: Normal range of motion. No rigidity.     Comments: Patient has tenderness anterior left shoulder and pain with flexion and abduction.  Patient had no significant bony tenderness distal left arm.  Neurovascular intact.  No midline cervical tenderness.  Skin:    General: Skin is warm.     Capillary Refill: Capillary refill takes less than 2 seconds.     Findings: No rash.  Neurological:     General: No focal deficit present.     Mental Status: She is alert.  Psychiatric:        Mood and Affect: Mood normal.     (all labs ordered are listed, but only abnormal results are displayed) Labs Reviewed - No data to display  EKG: None  Radiology: No results found.   Procedures   Medications Ordered in the ED  acetaminophen  (TYLENOL ) tablet 1,000 mg (1,000 mg Oral Given 04/12/24 1100)                                    Medical Decision Making Amount and/or Complexity of Data Reviewed Radiology: ordered.  Risk OTC drugs.   Patient presents after mechanical fall and isolated left shoulder injury/strain.  X-ray ordered dependently reviewed no acute fracture or dislocation.  Arm sling and supportive care Tylenol  for pain.  Outpatient follow-up discussed patient comfortable plan.  X-ray independently reviewed no fracture or dislocation.  Patient stable for discharge with arm sling.     Final diagnoses:  Left shoulder strain, initial encounter  Fall, initial encounter     ED Discharge Orders     None          Tonia Chew, MD 04/12/24 1230

## 2024-04-12 NOTE — Discharge Instructions (Addendum)
 We did not see any broken bones on your x-ray, the radiologist will over read it this afternoon. Use Tylenol  every 4 hours as needed for pain. Gradually increase use and range of motion of left shoulder as tolerated. Follow-up with the orthopedic physician.

## 2024-04-12 NOTE — ED Triage Notes (Signed)
 Pt accompanied by family. 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 No LOC and no thinners . Pt already in PT for left shoulder . Unable to lift left arm due to pain
